# Patient Record
Sex: Male | Born: 1935 | Race: White | Hispanic: No | Marital: Married | Smoking: Former smoker
Health system: Southern US, Community
[De-identification: ages and names within clinical notes are randomized; demographics above are authoritative.]

## PROBLEM LIST (undated history)

## (undated) DIAGNOSIS — N183 Chronic kidney disease, stage 3 unspecified: Secondary | ICD-10-CM

## (undated) DIAGNOSIS — F32A Depression, unspecified: Secondary | ICD-10-CM

## (undated) DIAGNOSIS — I219 Acute myocardial infarction, unspecified: Secondary | ICD-10-CM

## (undated) DIAGNOSIS — J449 Chronic obstructive pulmonary disease, unspecified: Secondary | ICD-10-CM

## (undated) DIAGNOSIS — F329 Major depressive disorder, single episode, unspecified: Secondary | ICD-10-CM

## (undated) DIAGNOSIS — F319 Bipolar disorder, unspecified: Secondary | ICD-10-CM

## (undated) DIAGNOSIS — E7800 Pure hypercholesterolemia, unspecified: Secondary | ICD-10-CM

## (undated) DIAGNOSIS — C3490 Malignant neoplasm of unspecified part of unspecified bronchus or lung: Secondary | ICD-10-CM

## (undated) DIAGNOSIS — I350 Nonrheumatic aortic (valve) stenosis: Secondary | ICD-10-CM

## (undated) DIAGNOSIS — Z9861 Coronary angioplasty status: Secondary | ICD-10-CM

## (undated) DIAGNOSIS — K219 Gastro-esophageal reflux disease without esophagitis: Secondary | ICD-10-CM

## (undated) DIAGNOSIS — I10 Essential (primary) hypertension: Secondary | ICD-10-CM

## (undated) DIAGNOSIS — D649 Anemia, unspecified: Secondary | ICD-10-CM

## (undated) DIAGNOSIS — I2510 Atherosclerotic heart disease of native coronary artery without angina pectoris: Secondary | ICD-10-CM

## (undated) DIAGNOSIS — I4510 Unspecified right bundle-branch block: Secondary | ICD-10-CM

## (undated) DIAGNOSIS — I714 Abdominal aortic aneurysm, without rupture: Secondary | ICD-10-CM

## (undated) HISTORY — PX: LUNG LOBECTOMY: SHX167

---

## 2005-05-25 HISTORY — PX: OTHER SURGICAL HISTORY: SHX169

## 2009-05-25 DIAGNOSIS — I714 Abdominal aortic aneurysm, without rupture, unspecified: Secondary | ICD-10-CM

## 2009-05-25 HISTORY — DX: Abdominal aortic aneurysm, without rupture, unspecified: I71.40

## 2009-05-25 HISTORY — DX: Abdominal aortic aneurysm, without rupture: I71.4

## 2009-05-25 HISTORY — PX: ABDOMINAL AORTIC ANEURYSM REPAIR: SHX42

## 2017-02-22 DIAGNOSIS — I350 Nonrheumatic aortic (valve) stenosis: Secondary | ICD-10-CM

## 2017-02-22 HISTORY — DX: Nonrheumatic aortic (valve) stenosis: I35.0

## 2017-05-22 ENCOUNTER — Other Ambulatory Visit: Payer: Self-pay

## 2017-05-22 ENCOUNTER — Inpatient Hospital Stay (HOSPITAL_BASED_OUTPATIENT_CLINIC_OR_DEPARTMENT_OTHER)
Admission: EM | Admit: 2017-05-22 | Discharge: 2017-05-24 | DRG: 309 | Disposition: A | Discharge status: Home / Self Care | Payer: Medicare HMO | Attending: Internal Medicine | Admitting: Internal Medicine

## 2017-05-22 ENCOUNTER — Emergency Department (HOSPITAL_BASED_OUTPATIENT_CLINIC_OR_DEPARTMENT_OTHER): Payer: Medicare HMO

## 2017-05-22 ENCOUNTER — Encounter (HOSPITAL_BASED_OUTPATIENT_CLINIC_OR_DEPARTMENT_OTHER): Payer: Self-pay | Admitting: Emergency Medicine

## 2017-05-22 DIAGNOSIS — I248 Other forms of acute ischemic heart disease: Secondary | ICD-10-CM | POA: Diagnosis present

## 2017-05-22 DIAGNOSIS — J441 Chronic obstructive pulmonary disease with (acute) exacerbation: Secondary | ICD-10-CM | POA: Diagnosis present

## 2017-05-22 DIAGNOSIS — I714 Abdominal aortic aneurysm, without rupture, unspecified: Secondary | ICD-10-CM | POA: Diagnosis present

## 2017-05-22 DIAGNOSIS — Z85118 Personal history of other malignant neoplasm of bronchus and lung: Secondary | ICD-10-CM

## 2017-05-22 DIAGNOSIS — Z87891 Personal history of nicotine dependence: Secondary | ICD-10-CM | POA: Diagnosis not present

## 2017-05-22 DIAGNOSIS — I469 Cardiac arrest, cause unspecified: Secondary | ICD-10-CM | POA: Diagnosis not present

## 2017-05-22 DIAGNOSIS — Z888 Allergy status to other drugs, medicaments and biological substances status: Secondary | ICD-10-CM

## 2017-05-22 DIAGNOSIS — I4891 Unspecified atrial fibrillation: Secondary | ICD-10-CM | POA: Diagnosis present

## 2017-05-22 DIAGNOSIS — Z9861 Coronary angioplasty status: Secondary | ICD-10-CM | POA: Diagnosis not present

## 2017-05-22 DIAGNOSIS — I2510 Atherosclerotic heart disease of native coronary artery without angina pectoris: Secondary | ICD-10-CM | POA: Diagnosis present

## 2017-05-22 DIAGNOSIS — R402413 Glasgow coma scale score 13-15, at hospital admission: Secondary | ICD-10-CM | POA: Diagnosis present

## 2017-05-22 DIAGNOSIS — D631 Anemia in chronic kidney disease: Secondary | ICD-10-CM | POA: Diagnosis present

## 2017-05-22 DIAGNOSIS — J069 Acute upper respiratory infection, unspecified: Secondary | ICD-10-CM | POA: Diagnosis present

## 2017-05-22 DIAGNOSIS — Z902 Acquired absence of lung [part of]: Secondary | ICD-10-CM | POA: Diagnosis not present

## 2017-05-22 DIAGNOSIS — E785 Hyperlipidemia, unspecified: Secondary | ICD-10-CM | POA: Diagnosis present

## 2017-05-22 DIAGNOSIS — I4510 Unspecified right bundle-branch block: Secondary | ICD-10-CM | POA: Diagnosis present

## 2017-05-22 DIAGNOSIS — I350 Nonrheumatic aortic (valve) stenosis: Secondary | ICD-10-CM

## 2017-05-22 DIAGNOSIS — I701 Atherosclerosis of renal artery: Secondary | ICD-10-CM | POA: Diagnosis present

## 2017-05-22 DIAGNOSIS — I480 Paroxysmal atrial fibrillation: Principal | ICD-10-CM | POA: Diagnosis present

## 2017-05-22 DIAGNOSIS — Z955 Presence of coronary angioplasty implant and graft: Secondary | ICD-10-CM

## 2017-05-22 DIAGNOSIS — I129 Hypertensive chronic kidney disease with stage 1 through stage 4 chronic kidney disease, or unspecified chronic kidney disease: Secondary | ICD-10-CM | POA: Diagnosis present

## 2017-05-22 DIAGNOSIS — R0902 Hypoxemia: Secondary | ICD-10-CM

## 2017-05-22 DIAGNOSIS — N184 Chronic kidney disease, stage 4 (severe): Secondary | ICD-10-CM | POA: Diagnosis present

## 2017-05-22 DIAGNOSIS — R7303 Prediabetes: Secondary | ICD-10-CM | POA: Diagnosis present

## 2017-05-22 DIAGNOSIS — I252 Old myocardial infarction: Secondary | ICD-10-CM | POA: Diagnosis not present

## 2017-05-22 DIAGNOSIS — Z7982 Long term (current) use of aspirin: Secondary | ICD-10-CM

## 2017-05-22 DIAGNOSIS — N183 Chronic kidney disease, stage 3 unspecified: Secondary | ICD-10-CM | POA: Diagnosis present

## 2017-05-22 DIAGNOSIS — D649 Anemia, unspecified: Secondary | ICD-10-CM | POA: Diagnosis present

## 2017-05-22 DIAGNOSIS — I10 Essential (primary) hypertension: Secondary | ICD-10-CM | POA: Diagnosis present

## 2017-05-22 DIAGNOSIS — F319 Bipolar disorder, unspecified: Secondary | ICD-10-CM | POA: Diagnosis present

## 2017-05-22 HISTORY — DX: Atherosclerotic heart disease of native coronary artery without angina pectoris: I25.10

## 2017-05-22 HISTORY — DX: Anemia, unspecified: D64.9

## 2017-05-22 HISTORY — DX: Essential (primary) hypertension: I10

## 2017-05-22 HISTORY — DX: Malignant neoplasm of unspecified part of unspecified bronchus or lung: C34.90

## 2017-05-22 HISTORY — DX: Gastro-esophageal reflux disease without esophagitis: K21.9

## 2017-05-22 HISTORY — DX: Major depressive disorder, single episode, unspecified: F32.9

## 2017-05-22 HISTORY — DX: Coronary angioplasty status: Z98.61

## 2017-05-22 HISTORY — DX: Chronic obstructive pulmonary disease, unspecified: J44.9

## 2017-05-22 HISTORY — DX: Acute myocardial infarction, unspecified: I21.9

## 2017-05-22 HISTORY — DX: Chronic kidney disease, stage 3 (moderate): N18.3

## 2017-05-22 HISTORY — DX: Chronic kidney disease, stage 3 unspecified: N18.30

## 2017-05-22 HISTORY — DX: Pure hypercholesterolemia, unspecified: E78.00

## 2017-05-22 HISTORY — DX: Depression, unspecified: F32.A

## 2017-05-22 HISTORY — DX: Bipolar disorder, unspecified: F31.9

## 2017-05-22 HISTORY — DX: Unspecified right bundle-branch block: I45.10

## 2017-05-22 HISTORY — DX: Nonrheumatic aortic (valve) stenosis: I35.0

## 2017-05-22 HISTORY — DX: Abdominal aortic aneurysm, without rupture: I71.4

## 2017-05-22 LAB — BASIC METABOLIC PANEL
ANION GAP: 8 (ref 5–15)
BUN: 40 mg/dL — ABNORMAL HIGH (ref 6–20)
CALCIUM: 8.9 mg/dL (ref 8.9–10.3)
CO2: 26 mmol/L (ref 22–32)
CREATININE: 2.36 mg/dL — AB (ref 0.61–1.24)
Chloride: 106 mmol/L (ref 101–111)
GFR, EST AFRICAN AMERICAN: 27 mL/min — AB (ref >60)
GFR, EST NON AFRICAN AMERICAN: 24 mL/min — AB (ref >60)
Glucose, Bld: 160 mg/dL — ABNORMAL HIGH (ref 65–99)
Potassium: 4.2 mmol/L (ref 3.5–5.1)
SODIUM: 140 mmol/L (ref 135–145)

## 2017-05-22 LAB — TROPONIN I
TROPONIN I: 0.06 ng/mL — AB (ref <0.03)
TROPONIN I: 0.07 ng/mL — AB (ref <0.03)

## 2017-05-22 LAB — CBC
HCT: 34.3 % — ABNORMAL LOW (ref 39.0–52.0)
HEMOGLOBIN: 10.6 g/dL — AB (ref 13.0–17.0)
MCH: 30 pg (ref 26.0–34.0)
MCHC: 30.9 g/dL (ref 30.0–36.0)
MCV: 97.2 fL (ref 78.0–100.0)
PLATELETS: 196 10*3/uL (ref 150–400)
RBC: 3.53 MIL/uL — AB (ref 4.22–5.81)
RDW: 14.3 % (ref 11.5–15.5)
WBC: 7.8 10*3/uL (ref 4.0–10.5)

## 2017-05-22 LAB — MRSA PCR SCREENING: MRSA BY PCR: NEGATIVE

## 2017-05-22 LAB — TSH: TSH: 0.733 u[IU]/mL (ref 0.350–4.500)

## 2017-05-22 MED ORDER — HEPARIN (PORCINE) IN NACL 100-0.45 UNIT/ML-% IJ SOLN
1200.0000 [IU]/h | INTRAMUSCULAR | Status: DC
  Administered 2017-05-22: 1000 [IU]/h via INTRAVENOUS
  Administered 2017-05-23: 1200 [IU]/h via INTRAVENOUS
  Filled 2017-05-22 (×2): qty 250

## 2017-05-22 MED ORDER — ACETAMINOPHEN 325 MG PO TABS: 650.0000 mg | ORAL_TABLET | Freq: Four times a day (QID) | ORAL | Status: DC | PRN

## 2017-05-22 MED ORDER — ONDANSETRON HCL 4 MG PO TABS: 4.0000 mg | ORAL_TABLET | Freq: Four times a day (QID) | ORAL | Status: DC | PRN

## 2017-05-22 MED ORDER — DILTIAZEM HCL 100 MG IV SOLR
5.0000 mg/h | INTRAVENOUS | Status: DC
  Administered 2017-05-22: 15 mg/h via INTRAVENOUS
  Administered 2017-05-22: 5 mg/h via INTRAVENOUS
  Filled 2017-05-22 (×2): qty 100

## 2017-05-22 MED ORDER — DILTIAZEM LOAD VIA INFUSION
10.0000 mg | Freq: Once | INTRAVENOUS | Status: AC
  Administered 2017-05-22: 10 mg via INTRAVENOUS
  Filled 2017-05-22: qty 10

## 2017-05-22 MED ORDER — ALBUTEROL SULFATE (2.5 MG/3ML) 0.083% IN NEBU: 2.5000 mg | INHALATION_SOLUTION | Freq: Four times a day (QID) | RESPIRATORY_TRACT | Status: DC | PRN

## 2017-05-22 MED ORDER — ASPIRIN 325 MG PO TABS
325.0000 mg | ORAL_TABLET | Freq: Every day | ORAL | Status: DC
  Administered 2017-05-22 – 2017-05-23 (×2): 325 mg via ORAL
  Filled 2017-05-22 (×2): qty 1

## 2017-05-22 MED ORDER — HEPARIN BOLUS VIA INFUSION
3000.0000 [IU] | Freq: Once | INTRAVENOUS | Status: AC
Start: 2017-05-22 — End: 2017-05-22
  Administered 2017-05-22: 3000 [IU] via INTRAVENOUS

## 2017-05-22 MED ORDER — LAMOTRIGINE 100 MG PO TABS
300.0000 mg | ORAL_TABLET | Freq: Every day | ORAL | Status: DC
  Administered 2017-05-23 – 2017-05-24 (×2): 300 mg via ORAL
  Filled 2017-05-22 (×2): qty 3

## 2017-05-22 MED ORDER — MULTIVITAMINS PO CAPS: 1.0000 | ORAL_CAPSULE | Freq: Every day | ORAL | Status: DC

## 2017-05-22 MED ORDER — ADULT MULTIVITAMIN W/MINERALS CH
1.0000 | ORAL_TABLET | Freq: Every day | ORAL | Status: DC
  Administered 2017-05-23 – 2017-05-24 (×2): 1 via ORAL
  Filled 2017-05-22 (×2): qty 1

## 2017-05-22 MED ORDER — FEBUXOSTAT 40 MG PO TABS
40.0000 mg | ORAL_TABLET | Freq: Every day | ORAL | Status: DC
  Administered 2017-05-23 – 2017-05-24 (×2): 40 mg via ORAL
  Filled 2017-05-22 (×3): qty 1

## 2017-05-22 MED ORDER — ALBUTEROL SULFATE HFA 108 (90 BASE) MCG/ACT IN AERS: 2.0000 | INHALATION_SPRAY | Freq: Four times a day (QID) | RESPIRATORY_TRACT | Status: DC | PRN

## 2017-05-22 MED ORDER — ATORVASTATIN CALCIUM 10 MG PO TABS
10.0000 mg | ORAL_TABLET | Freq: Every day | ORAL | Status: DC
  Administered 2017-05-22 – 2017-05-23 (×2): 10 mg via ORAL
  Filled 2017-05-22 (×3): qty 1

## 2017-05-22 MED ORDER — ONDANSETRON HCL 4 MG/2ML IJ SOLN: 4.0000 mg | Freq: Four times a day (QID) | INTRAMUSCULAR | Status: DC | PRN

## 2017-05-22 MED ORDER — METOPROLOL TARTRATE 50 MG PO TABS
50.0000 mg | ORAL_TABLET | Freq: Two times a day (BID) | ORAL | Status: DC
  Administered 2017-05-22: 50 mg via ORAL
  Filled 2017-05-22: qty 1

## 2017-05-22 MED ORDER — ACETAMINOPHEN 650 MG RE SUPP: 650.0000 mg | Freq: Four times a day (QID) | RECTAL | Status: DC | PRN

## 2017-05-22 NOTE — Progress Notes (Signed)
ANTICOAGULATION CONSULT NOTE - Initial Consult  Pharmacy Consult for Heparin Indication: atrial fibrillation  Allergies  Allergen Reactions  . Norvasc [Amlodipine Besylate]     Ankle swelling    Patient Measurements: Height: 5\' 11"  (180.3 cm) Weight: 180 lb (81.6 kg) IBW/kg (Calculated) : 75.3  Vital Signs: Temp: 98.1 F (36.7 C) (12/29 1124) Temp Source: Oral (12/29 1124) BP: 118/71 (12/29 1230) Pulse Rate: 97 (12/29 1230)  Labs: Recent Labs    05/22/17 1125  HGB 10.6*  HCT 34.3*  PLT 196  CREATININE 2.36*    Estimated Creatinine Clearance: 24.4 mL/min (A) (by C-G formula based on SCr of 2.36 mg/dL (H)).   Medical History: Past Medical History:  Diagnosis Date  . High cholesterol   . Lung cancer ([place])   . MI (myocardial infarction) [facility])     Assessment: 83 year old male to begin heparin for Afib, with renal insufficiency, not on anti-coagulation prior to admission  Goal of Therapy:  Heparin level 0.3-0.7 units/ml Monitor platelets by anticoagulation protocol: Yes   Plan:  Heparin 3000 units iv bolus x 1 Heparin drip at 1000 units / hr Heparin level 8 hours after heparin starts Daily heparin level, CBC  Thank you Anette Guarneri, PharmD [phone]  05/22/2017,12:43 PM

## 2017-05-22 NOTE — ED Notes (Signed)
Patient transported to X-ray 

## 2017-05-22 NOTE — Progress Notes (Signed)
Pt converted to sinus bradycardia briefly with rate 58-61 and 3.2 sec pause after receiving PO metoprolol.  IV diltiazem was discontinued per standing parameters/orders.  Pt maintaining afib/flutter with rate 70s to 80s.  Pt denies any c/o.  NP notified via [place].  Will cont to monitor pt closely.

## 2017-05-22 NOTE — ED Notes (Signed)
Pt given snack with verbal approval from Dr. Laverta Baltimore. Family at bedside

## 2017-05-22 NOTE — ED Notes (Signed)
ED Provider at bedside. 

## 2017-05-22 NOTE — ED Triage Notes (Signed)
Pt c/o chest pain with shortness of breath onset yesterday around lunch. Symptoms resolved last night. This morning pt reports shortness of breath and feeling weakness about 30 mins after waking up. Speech clear, grips equal, no facial droop.

## 2017-05-22 NOTE — H&P (Signed)
History and Physical    Mark Myers EVO:[id number] DOB: Nov 09, 1929 DOA: 05/22/2017  Referring MD/NP/PA: EDP PCP:  Patient coming from: [place]  Chief Complaint: dizziness/chest pressure and dyspnea  HPI: Mark Myers is a 83 y.o. male with medical history significant of CAD with RCA stent in 2007, CKD3, AAA s/p EVAR in 2011, chronic right bundle branch block, prediabetes, prior history of lung cancer, bipolar disorder presented to the ER with the above symptoms. Patient reports being his in his usual state of health until yesterday when he noticed feeling a little dizzy and weak and mild vague tightness in the middle of his chest, this subsequently subsided and he went to bed after waking up this morning he started feeling dizzy again, this was associated with weakness and mild shortness of breath. He has a chronic cough but denies any changes, also has mild leg edema at baseline. He denies any changes in his medications no fevers or chills no nausea vomiting or diarrhea ED Course: In the emergency room [facility] he was noted to have rapid heart rate of 124 which was irregular with 5 QRS in the setting of known baseline right bundle branch block. Subsequently ED resident called cardiology on call who felt that the rhythm was consistent with atrial fibrillation in the background of old RBBB and hence he was started on a Cardizem drip along with heparin drip and transferred to [facility], troponin I minimally elevated at 0.06 and creatinine at 2.3 which is close to his baseline of 2. Chest x-ray raises concern for patchy consolidation at bases,  ?? Pneumonia  Review of Systems: As per HPI otherwise 14 point review of systems negative.   Past Medical History:  Diagnosis Date  . High cholesterol   . Lung cancer ([place])   . MI (myocardial infarction) [facility])     Past Surgical History:  Procedure Laterality Date  . heart stents     x 2      reports that he has quit  smoking. he has never used smokeless tobacco. He reports that he drinks alcohol. He reports that he does not use drugs.  Allergies  Allergen Reactions  . Norvasc [Amlodipine Besylate]     Ankle swelling    No family history on file.   Prior to Admission medications   Medication Sig Start Date End Date Taking? Authorizing Provider  aspirin 325 MG tablet Take 325 mg by mouth daily.   Yes [provider]  atorvastatin (LIPITOR) 10 MG tablet Take 10 mg by mouth daily.   Yes [provider]  Cholecalciferol (VITAMIN D-1000 MAX ST) 1000 units tablet Take 1,000 Units by mouth daily.   Yes [provider]  doxazosin (CARDURA) 2 MG tablet Take 2 mg by mouth every evening. 05/17/17  Yes [provider]  furosemide (LASIX) 20 MG tablet Take 20 mg by mouth.   Yes [provider]  hydrALAZINE (APRESOLINE) 50 MG tablet Take 50 mg by mouth 3 (three) times daily. 05/11/17  Yes [provider]  hydrochlorothiazide (HYDRODIURIL) 25 MG tablet Take 25 mg by mouth daily.   Yes [provider]  lamoTRIgine (LAMICTAL) 100 MG tablet Take 300 mg by mouth daily. 03/29/17  Yes [provider]  metoprolol tartrate (LOPRESSOR) 25 MG tablet Take 25 mg by mouth 2 (two) times daily.   Yes [provider]  Multiple Vitamin (MULTIVITAMIN) capsule Take 1 capsule by mouth daily. 11/07/09  Yes [provider]  ULORIC 40 MG tablet Take 40 mg by mouth daily. 04/19/17  Yes [provider]  VENTOLIN HFA 108 (90 Base) MCG/ACT inhaler Inhale 2 puffs into the lungs every 6 (six) hours as needed for wheezing. 05/17/17  Yes [provider]    Physical Exam: Vitals:   05/22/17 1646 05/22/17 1650 05/22/17 1657 05/22/17 1757  BP:    (!) 128/59  Pulse: (!) 133 80 78 (!) 125  Resp: (!) 25 (!) 21 (!) 21   Temp:    98.6 F (37 C)  TempSrc:    Oral  SpO2: 98% 98% 97% 96%  Weight:      Height:          Constitutional: NAD,  calm, comfortable Vitals:   05/22/17 1646 05/22/17 1650 05/22/17 1657 05/22/17 1757  BP:    (!) 128/59  Pulse: (!) 133 80 78 (!) 125  Resp: (!) 25 (!) 21 (!) 21   Temp:    98.6 F (37 C)  TempSrc:    Oral  SpO2: 98% 98% 97% 96%  Weight:      Height:       Eyes: PERRL, lids and conjunctivae normal ENMT: Mucous membranes are moist.  Neck: normal, supple, no JVD Respiratory: Poor air movement, decreased breath sounds at both bases, no wheezes or rhonchi noted Cardiovascular: S1-S2 irregularly irregular, systolic ejection murmur noted Abdomen: soft, non tender, Bowel sounds positive.  Musculoskeletal: No joint deformity upper and lower extremities. Ext: 1+ edema Skin: no rashes, lesions, ulcers.  Neurologic: Moves all extremities no localizing signs Psychiatric: Normal judgment and insight. Alert and oriented x 3. Normal mood.   Labs on Admission: I have personally reviewed following labs and imaging studies  CBC: Recent Labs  Lab 05/22/17 1125  WBC 7.8  HGB 10.6*  HCT 34.3*  MCV 97.2  PLT 315   Basic Metabolic Panel: Recent Labs  Lab 05/22/17 1125  NA 140  K 4.2  CL 106  CO2 26  GLUCOSE 160*  BUN 40*  CREATININE 2.36*  CALCIUM 8.9   GFR: Estimated Creatinine Clearance: 24.4 mL/min (A) (by C-G formula based on SCr of 2.36 mg/dL (H)). Liver Function Tests: No results for input(s): AST, ALT, ALKPHOS, BILITOT, PROT, ALBUMIN in the last 168 hours. No results for input(s): LIPASE, AMYLASE in the last 168 hours. No results for input(s): AMMONIA in the last 168 hours. Coagulation Profile: No results for input(s): INR, PROTIME in the last 168 hours. Cardiac Enzymes: Recent Labs  Lab 05/22/17 1124  TROPONINI 0.06*   BNP (last 3 results) No results for input(s): PROBNP in the last 8760 hours. HbA1C: No results for input(s): HGBA1C in the last 72 hours. CBG: No results for input(s): GLUCAP in the last 168 hours. Lipid Profile: No results for input(s): CHOL,  HDL, LDLCALC, TRIG, CHOLHDL, LDLDIRECT in the last 72 hours. Thyroid Function Tests: No results for input(s): TSH, T4TOTAL, FREET4, T3FREE, THYROIDAB in the last 72 hours. Anemia Panel: No results for input(s): VITAMINB12, FOLATE, FERRITIN, TIBC, IRON, RETICCTPCT in the last 72 hours. Urine analysis: No results found for: COLORURINE, APPEARANCEUR, LABSPEC, PHURINE, GLUCOSEU, HGBUR, BILIRUBINUR, KETONESUR, PROTEINUR, UROBILINOGEN, NITRITE, LEUKOCYTESUR Sepsis Labs: @LABRCNTIP (procalcitonin:4,lacticidven:4) )No results found for this or any previous visit (from the past 240 hour(s)).   Radiological Exams on Admission: Dg Chest 2 View  Result Date: 05/22/2017 CLINICAL DATA:  Shortness of breath since yesterday EXAM: CHEST  2 VIEW COMPARISON:  None. FINDINGS: The mediastinal contour is normal. The heart size is  mildly enlarged. Patchy consolidation of bilateral lung bases are identified. There is no pulmonary edema or pleural effusion. The lungs are hyperinflated. No acute abnormality is identified in the visualized bones. Aortic stent is noted. IMPRESSION: Patchy consolidation of bilateral lung bases, pneumonia is not excluded particularly in the right lung base. Electronically Signed   By: Abelardo Diesel M.D.   On: 05/22/2017 12:16    EKG: Independently reviewed. RBBB with non specific ST T wave changes  Assessment/Plan    Atrial fibrillation with RVR (HCC) -not very clear since has RBBB as underlying rhythm -Started on a Cardizem and heparin drip, continue same -chads2vasc score>3 -will need cardiology consult in a.m -Resume metoprolol, , will increase home dose from 25 to 50 mg twice a day -Check TSH, he recently had an echocardiogram done at [facility] 2 months ago, I'm not able to see this in care everywhere, if this cannot be accessed will need repeat echo  History of CAD  -remote PCI/stent to RCA in 2007 -troponin minimally elevated likley due to demand will cycle  troponin -Continue beta blocker and statin, add aspirin  Moderate to severe aortic stenosis -Followed by cardiologist at [facility]  CKD 3 -Creatinine close to baseline, monitor -Avoid hypotension  COPD -Stable, nebs when necessary  ?? Patchy consolidation at bases on portable chest x-ray -Clinically no signs of pneumonia, he's had a chronic cough for months to years -No fever or leukocytosis -Repeat chest x-ray tomorrow, monitor without antibiotics   AAA s/p EVAR in 2011    H/o Lung Ca s/p resection, - no recurrence based on last scans in [facility]  DVT prophylaxis: on heparin Code Status: Full Code Family Communication: son at bedside Disposition Plan: home when stable Consults called: -Needs cards consult in am  Admission status: inpatient  Domenic Polite MD [facility] Pager [phone]  If 7PM-7AM, please contact night-coverage [email or web address] Password [facility]  05/22/2017, 6:37 PM

## 2017-05-22 NOTE — ED Notes (Signed)
Urinal provided.

## 2017-05-22 NOTE — ED Notes (Signed)
Dr. Laverta Baltimore at bedside to discuss admission with pt and family

## 2017-05-22 NOTE — ED Provider Notes (Signed)
[facility] EMERGENCY DEPARTMENT Provider Note   CSN: [id number] Arrival date & time: 05/22/17  1110     History   Chief Complaint Chief Complaint  Patient presents with  . Shortness of Breath  . Chest Pain    HPI Mark Myers is a 83 y.o. male.  HPI 83 yo male with PMH of CAD with stent in 2007, CKD3, RBBB, renal artery stenosis, aortic stenosis, hx of lung cancer, Bipolar DO, prediabetes, AAA s/p EVAR 2011 who presents with generalized weakness and dyspnea on exertion. Symptoms began last night while he was watching tv. He started to have some chest tightness without associated shortness of breath, nausea or diaphoresis.This lasted a few minutes and self resolved. He reports that it actually improved with ambulation. It was not similar to his chest pain symptoms in 2007 when he was diagnosed with MI. This morning he woke up and was getting ready. He felt generally weak and had dyspnea on exertion. He checked his heart rate which was in the 130s which prompted him to come to the ED for evaluation. He currently has not chest pain or shortness of breath. His son reported of dyspnea on exertion from the ED parking lot to the entrance. Patient also reports of mild lower extremity swelling bilaterally for the past month; this improves with elevation of his legs. Denies recent travel or calf pain. No personal history of DVT or PE.   Past Medical History:  Diagnosis Date  . High cholesterol   . Lung cancer ([place])   . MI (myocardial infarction) [facility])     Patient Active Problem List   Diagnosis Date Noted  . Atrial fibrillation with RVR ([place]) 05/22/2017    Past Surgical History:  Procedure Laterality Date  . heart stents     x 2        Home Medications    Prior to Admission medications   Medication Sig Start Date End Date Taking? Authorizing Provider  aspirin 325 MG tablet Take 325 mg by mouth daily.   Yes [provider]  atorvastatin (LIPITOR) 10 MG tablet  Take 10 mg by mouth daily.   Yes [provider]  furosemide (LASIX) 20 MG tablet Take 20 mg by mouth.   Yes [provider]  hydrochlorothiazide (HYDRODIURIL) 25 MG tablet Take 25 mg by mouth daily.   Yes [provider]  metoprolol tartrate (LOPRESSOR) 25 MG tablet Take 25 mg by mouth 2 (two) times daily.   Yes [provider]    Family History No family history on file.  Social History Social History   Tobacco Use  . Smoking status: Former Research scientist (life sciences)  . Smokeless tobacco: Never Used  Substance Use Topics  . Alcohol use: Yes    Frequency: Never  . Drug use: No     Allergies   Norvasc [amlodipine besylate]   Review of Systems Review of Systems  Constitutional: Negative for chills and fever.  HENT: Negative for rhinorrhea and sore throat.   Respiratory: Positive for chest tightness and shortness of breath (dyspnea on exertion). Negative for cough.   Gastrointestinal: Negative for abdominal pain, diarrhea and nausea.  Neurological: Positive for weakness (generalized).  Psychiatric/Behavioral: Negative for confusion.     Physical Exam Updated Vital Signs BP 121/82   Pulse (!) 131   Temp 98.1 F (36.7 C) (Oral)   Resp 16   Ht 5\' 11"  (1.803 m)   Wt 81.6 kg (180 lb)   SpO2 97%  BMI 25.10 kg/m   Physical Exam  Constitutional: He is oriented to person, place, and time. He appears well-developed and well-nourished.  Non-toxic appearance. He does not appear ill.  Eyes: EOM are normal. Pupils are equal, round, and reactive to light.  Cardiovascular:  Murmur heard. Irregularly irregular, tachycardia   Pulmonary/Chest: Effort normal. No accessory muscle usage. Tachypnea (mild) noted. No respiratory distress. He has no decreased breath sounds. He has no wheezes. He has no rhonchi. He has no rales.  Abdominal: Soft. Bowel sounds are normal. There is no tenderness.  Neurological: He is alert and oriented to person, place, and time. He has  normal strength. No cranial nerve deficit or sensory deficit.  Skin: Skin is warm and dry. Capillary refill takes less than 2 seconds. He is not diaphoretic.  Psychiatric: He has a normal mood and affect. His behavior is normal.     ED Treatments / Results  Labs (all labs ordered are listed, but only abnormal results are displayed) Labs Reviewed  BASIC METABOLIC PANEL - Abnormal; Notable for the following components:      Result Value   Glucose, Bld 160 (*)    BUN 40 (*)    Creatinine, Ser 2.36 (*)    GFR calc non Af Amer 24 (*)    GFR calc Af Amer 27 (*)    All other components within normal limits  CBC - Abnormal; Notable for the following components:   RBC 3.53 (*)    Hemoglobin 10.6 (*)    HCT 34.3 (*)    All other components within normal limits  TROPONIN I - Abnormal; Notable for the following components:   Troponin I 0.06 (*)    All other components within normal limits  HEPARIN LEVEL (UNFRACTIONATED)    EKG  EKG Interpretation  Date/Time:  Saturday May 22 2017 11:42:06 EST Ventricular Rate:  86 PR Interval:    QRS Duration: 134 QT Interval:  397 QTC Calculation: 475 R Axis:   -105 Text Interpretation:  Sinus rhythm Right bundle branch block No STEMI.  Confirmed by Nanda Quinton [phone]) on 05/22/2017 12:03:47 PM       Radiology Dg Chest 2 View  Result Date: 05/22/2017 CLINICAL DATA:  Shortness of breath since yesterday EXAM: CHEST  2 VIEW COMPARISON:  None. FINDINGS: The mediastinal contour is normal. The heart size is mildly enlarged. Patchy consolidation of bilateral lung bases are identified. There is no pulmonary edema or pleural effusion. The lungs are hyperinflated. No acute abnormality is identified in the visualized bones. Aortic stent is noted. IMPRESSION: Patchy consolidation of bilateral lung bases, pneumonia is not excluded particularly in the right lung base. Electronically Signed   By: Abelardo Diesel M.D.   On: 05/22/2017 12:16     Procedures Procedures (including critical care time)  Medications Ordered in ED Medications  diltiazem (CARDIZEM) 1 mg/mL load via infusion 10 mg (10 mg Intravenous Bolus from Bag 05/22/17 1220)    And  diltiazem (CARDIZEM) 100 mg in dextrose 5 % 100 mL (1 mg/mL) infusion (10 mg/hr Intravenous Rate/Dose Change 05/22/17 1256)  heparin ADULT infusion 100 units/mL (25000 units/253mL sodium chloride 0.45%) (1,000 Units/hr Intravenous New Bag/Given 05/22/17 1324)  heparin bolus via infusion 3,000 Units (3,000 Units Intravenous Bolus from Bag 05/22/17 1324)     Initial Impression / Assessment and Plan / ED Course  I have reviewed the triage vital signs and the nursing notes.  Pertinent labs & imaging results that were available during my care of  the patient were reviewed by me and considered in my medical decision making (see chart for details).  83 yo male with PMH of CAD with stent in 2007, CKD3, RBBB, renal artery stenosis, aortic stenosis, hx of lung cancer, Bipolar DO, prediabetes, AAA s/p EVAR 2011 who presents with generalized weakness and dyspnea on exertion. Initial vitals with HR of 124 irregularly irregular but with stable blood pressure and no current symptoms. EKG with wide QRS (he has known RBBB). Second EKG obtained during a period of time with normal HR; EKG at that time revealed NSR with RBBB. Also discussed with on call cardiology. Rhythm is consistent with atrial fibrillation (in the setting of old RBBB). Started patient on Cardizem drip. His heart rate improved to upper 90s to low 100 with titration of Cardizem. CBC with stable anemia. BMP with mild AKI to 2.36 (baseline appears around 2). Troponin I 0.06, likely mildly elevated due to demand in the setting of afib. Spoke with [facility] who will accept patient for admission. Also started on IV heparin due to [facility] of at least 2.   Final Clinical Impressions(s) / ED Diagnoses   Final diagnoses:  Atrial fibrillation with  RVR [facility])    ED Discharge Orders    None       Smiley Houseman, MD 05/22/17 1400    Margette Fast, MD 05/22/17 [phone]

## 2017-05-22 NOTE — Plan of Care (Signed)
Oriented to unit/room. POC discussed with Pt and family.

## 2017-05-22 NOTE — ED Notes (Signed)
Troponin 0.06, results given to ED MD

## 2017-05-23 ENCOUNTER — Encounter (HOSPITAL_COMMUNITY): Payer: Self-pay | Admitting: Cardiology

## 2017-05-23 ENCOUNTER — Inpatient Hospital Stay (HOSPITAL_COMMUNITY): Payer: Medicare HMO

## 2017-05-23 DIAGNOSIS — N183 Chronic kidney disease, stage 3 unspecified: Secondary | ICD-10-CM | POA: Diagnosis present

## 2017-05-23 DIAGNOSIS — J069 Acute upper respiratory infection, unspecified: Secondary | ICD-10-CM | POA: Diagnosis present

## 2017-05-23 DIAGNOSIS — F319 Bipolar disorder, unspecified: Secondary | ICD-10-CM | POA: Diagnosis present

## 2017-05-23 DIAGNOSIS — I4510 Unspecified right bundle-branch block: Secondary | ICD-10-CM | POA: Diagnosis present

## 2017-05-23 DIAGNOSIS — I714 Abdominal aortic aneurysm, without rupture: Secondary | ICD-10-CM

## 2017-05-23 DIAGNOSIS — Z9861 Coronary angioplasty status: Secondary | ICD-10-CM

## 2017-05-23 DIAGNOSIS — I2510 Atherosclerotic heart disease of native coronary artery without angina pectoris: Secondary | ICD-10-CM

## 2017-05-23 DIAGNOSIS — D649 Anemia, unspecified: Secondary | ICD-10-CM | POA: Diagnosis present

## 2017-05-23 DIAGNOSIS — I10 Essential (primary) hypertension: Secondary | ICD-10-CM | POA: Diagnosis present

## 2017-05-23 DIAGNOSIS — I469 Cardiac arrest, cause unspecified: Secondary | ICD-10-CM | POA: Diagnosis not present

## 2017-05-23 LAB — CBC
HEMATOCRIT: 31.4 % — AB (ref 39.0–52.0)
Hemoglobin: 9.7 g/dL — ABNORMAL LOW (ref 13.0–17.0)
MCH: 29.7 pg (ref 26.0–34.0)
MCHC: 30.9 g/dL (ref 30.0–36.0)
MCV: 96 fL (ref 78.0–100.0)
Platelets: 186 10*3/uL (ref 150–400)
RBC: 3.27 MIL/uL — ABNORMAL LOW (ref 4.22–5.81)
RDW: 14.9 % (ref 11.5–15.5)
WBC: 6.2 10*3/uL (ref 4.0–10.5)

## 2017-05-23 LAB — COMPREHENSIVE METABOLIC PANEL
ALK PHOS: 62 U/L (ref 38–126)
ALT: 11 U/L — ABNORMAL LOW (ref 17–63)
ANION GAP: 6 (ref 5–15)
AST: 16 U/L (ref 15–41)
Albumin: 2.8 g/dL — ABNORMAL LOW (ref 3.5–5.0)
BILIRUBIN TOTAL: 0.6 mg/dL (ref 0.3–1.2)
BUN: 33 mg/dL — ABNORMAL HIGH (ref 6–20)
CALCIUM: 8.5 mg/dL — AB (ref 8.9–10.3)
CO2: 25 mmol/L (ref 22–32)
CREATININE: 2.21 mg/dL — AB (ref 0.61–1.24)
Chloride: 110 mmol/L (ref 101–111)
GFR, EST AFRICAN AMERICAN: 30 mL/min — AB (ref >60)
GFR, EST NON AFRICAN AMERICAN: 25 mL/min — AB (ref >60)
Glucose, Bld: 117 mg/dL — ABNORMAL HIGH (ref 65–99)
Potassium: 4.3 mmol/L (ref 3.5–5.1)
Sodium: 141 mmol/L (ref 135–145)
TOTAL PROTEIN: 5.4 g/dL — AB (ref 6.5–8.1)

## 2017-05-23 LAB — TROPONIN I: Troponin I: 0.07 ng/mL (ref <0.03)

## 2017-05-23 LAB — HEPARIN LEVEL (UNFRACTIONATED)
Heparin Unfractionated: 0.17 IU/mL — ABNORMAL LOW (ref 0.30–0.70)
Heparin Unfractionated: 0.41 IU/mL (ref 0.30–0.70)

## 2017-05-23 MED ORDER — METOPROLOL TARTRATE 25 MG PO TABS
37.5000 mg | ORAL_TABLET | Freq: Two times a day (BID) | ORAL | Status: DC
  Administered 2017-05-23 – 2017-05-24 (×2): 37.5 mg via ORAL
  Filled 2017-05-23 (×2): qty 1

## 2017-05-23 MED ORDER — PREDNISONE 20 MG PO TABS
40.0000 mg | ORAL_TABLET | Freq: Every day | ORAL | Status: DC
  Administered 2017-05-23 – 2017-05-24 (×2): 40 mg via ORAL
  Filled 2017-05-23 (×2): qty 2

## 2017-05-23 MED ORDER — HEPARIN BOLUS VIA INFUSION
2000.0000 [IU] | Freq: Once | INTRAVENOUS | Status: AC
  Administered 2017-05-23: 2000 [IU] via INTRAVENOUS
  Filled 2017-05-23: qty 2000

## 2017-05-23 MED ORDER — METOPROLOL TARTRATE 25 MG PO TABS
25.0000 mg | ORAL_TABLET | Freq: Two times a day (BID) | ORAL | Status: DC
  Administered 2017-05-23: 25 mg via ORAL
  Filled 2017-05-23: qty 1

## 2017-05-23 MED ORDER — IPRATROPIUM-ALBUTEROL 0.5-2.5 (3) MG/3ML IN SOLN
3.0000 mL | Freq: Four times a day (QID) | RESPIRATORY_TRACT | Status: DC
  Administered 2017-05-23 – 2017-05-24 (×3): 3 mL via RESPIRATORY_TRACT
  Filled 2017-05-23 (×4): qty 3

## 2017-05-23 MED ORDER — APIXABAN 2.5 MG PO TABS
2.5000 mg | ORAL_TABLET | Freq: Two times a day (BID) | ORAL | Status: DC
  Administered 2017-05-23 – 2017-05-24 (×3): 2.5 mg via ORAL
  Filled 2017-05-23 (×3): qty 1

## 2017-05-23 MED ORDER — ASPIRIN 81 MG PO CHEW
81.0000 mg | CHEWABLE_TABLET | Freq: Every day | ORAL | Status: DC
  Administered 2017-05-24: 81 mg via ORAL
  Filled 2017-05-23: qty 1

## 2017-05-23 NOTE — Progress Notes (Signed)
ANTICOAGULATION CONSULT NOTE - Follow Up Consult  Pharmacy Consult for heparin Indication: atrial fibrillation  Labs: Recent Labs    05/22/17 1124 05/22/17 1125 05/22/17 1848 05/22/17 2340  HGB  --  10.6*  --  9.7*  HCT  --  34.3*  --  31.4*  PLT  --  196  --  186  HEPARINUNFRC  --   --   --  0.17*  CREATININE  --  2.36*  --   --   TROPONINI 0.06*  --  0.07*  --     Assessment: 83yo male subtherapeutic on heparin with initial dosing for Afib.  Goal of Therapy:  Heparin level 0.3-0.7 units/ml   Plan:  Will rebolus with heparin 2000 units and increase gtt by 3 units/kg/hr to 1200 units/hr and check level in [place], PharmD, BCPS  05/23/2017,1:30 AM

## 2017-05-23 NOTE — Progress Notes (Signed)
ANTICOAGULATION CONSULT NOTE - Follow Up Consult  Pharmacy Consult for Heparin Indication: atrial fibrillation  Allergies  Allergen Reactions  . Norvasc [Amlodipine Besylate]     Ankle swelling    Patient Measurements: Height: 5\' 11"  (180.3 cm) Weight: 185 lb 6.4 oz (84.1 kg) IBW/kg (Calculated) : 75.3 Heparin Dosing Weight: 81.6 kg  Vital Signs: Temp: 97.7 F (36.5 C) (12/30 0820) Temp Source: Oral (12/30 0820) BP: 150/77 (12/30 0820) Pulse Rate: 71 (12/30 0820)  Labs: Recent Labs    05/22/17 1124 05/22/17 1125 05/22/17 1848 05/22/17 2340 05/23/17 0905  HGB  --  10.6*  --  9.7*  --   HCT  --  34.3*  --  31.4*  --   PLT  --  196  --  186  --   HEPARINUNFRC  --   --   --  0.17* 0.41  CREATININE  --  2.36*  --  2.21*  --   TROPONINI 0.06*  --  0.07* 0.07*  --     Estimated Creatinine Clearance: 26 mL/min (A) (by C-G formula based on SCr of 2.21 mg/dL (H)).  Assessment: 65 yoM presents with dizziness, chest pressure, and SOB, found to be in afib with RVR. Pharmacy consulted to dose IV heparin. Heparin level now therapeutic at 0.41 after re-bolus and rate increase. Hgb 9.7, pltc WNL. No bleeding noted.  Goal of Therapy:  Heparin level 0.3-0.7 units/ml Monitor platelets by anticoagulation protocol: Yes   Plan:  Continue heparin gtt at 1200 units/hr Daily heparin level, CBC Monitor for s/sx of bleeding  Erin N. Gerarda Fraction, PharmD PGY1 Pharmacy Resident Pager: [phone] 05/23/2017,9:52 AM

## 2017-05-23 NOTE — Progress Notes (Signed)
PROGRESS NOTE    Mark Myers  VQQ:[id number] DOB: Apr 09, 1930 DOA: 05/22/2017 PCP: No primary care provider on file.  Brief Narrative: Mark Myers is a 83 y.o. male with medical history significant of CAD with RCA stent in 2007, CKD3, AAA s/p EVAR in 2011, chronic right bundle branch block, prediabetes, prior history of lung cancer, bipolar disorder presented to the ER with the above symptoms. Patient reports being his in his usual state of health until yesterday when he noticed feeling a little dizzy and weak and mild vague tightness in the middle of his chest, this subsequently subsided and he went to bed after waking up this morning he started feeling dizzy again, this was associated with weakness and mild shortness of breath In ED, found to be in AFib with RVR started Cardizem gtt with heparin and [facility]  Assessment & Plan:   Atrial fibrillation with RVR ([place]) -has RBBB as underlying rhythm -He was started on a Cardizem drip yesterday evening, subsequently overnight converted to sinus rhythm with a 5 second pause, Cardizem drip discontinued -chads2vasc score>3 -Cardiology consulted -I had increased his home dose of metoprolol -We will consider transition to anticoagulation, possibly use lower dose Eliquis -Closely followed by cardiologist at [facility] and reportedly has severe aortic stenosis based on last echo in October which were not able to see  History of CAD  -remote PCI/stent to RCA in 2007 -troponin minimally elevated likley due to demand will cycle troponin -Continue beta blocker and statin  Moderate to severe aortic stenosis -Followed by cardiologist at [facility]  CKD 3 -Creatinine close to baseline, monitor -Avoid hypotension  COPD -Stable, nebs when necessary  ?? Patchy consolidation at bases on portable chest x-ray -Clinically no signs of pneumonia, he's had a chronic cough for months to years -No fever or leukocytosis -Repeat chest x-ray today, add  nebs, low dose prednisone and monitor   AAA s/p EVAR in 2011    H/o Lung Ca s/p resection, - no recurrence based on last scans in [facility]  DVT prophylaxis: on heparin Code Status: Full Code Family Communication: son at bedside Disposition Plan: home when stable    Consultants:   Cardiology   Procedures:   Antimicrobials:    Subjective: -feels much better, converted ti NSR last night following a 5sec pause  Objective: Vitals:   05/23/17 0000 05/23/17 0445 05/23/17 0820 05/23/17 1100  BP: 129/66 127/83 (!) 150/77 (!) 153/81  Pulse: 71 72 71 62  Resp: 20 20    Temp: 98.7 F (37.1 C) 98.3 F (36.8 C) 97.7 F (36.5 C) 98.4 F (36.9 C)  TempSrc: Oral Oral Oral Oral  SpO2: 96% 96% 99% 98%  Weight:  84.1 kg (185 lb 6.4 oz)    Height:        Intake/Output Summary (Last 24 hours) at 05/23/2017 1201 Last data filed at 05/23/2017 0300 Gross per 24 hour  Intake 352.37 ml  Output 750 ml  Net -397.63 ml   Filed Weights   05/22/17 1126 05/23/17 0445  Weight: 81.6 kg (180 lb) 84.1 kg (185 lb 6.4 oz)    Examination:  General exam: Appears calm and comfortable  Respiratory system: decreased BS on left, rare wheezes Cardiovascular system: S1 & S2 heard, RRR. No JVD, murmurs, rubs, gallops Gastrointestinal system: Abdomen is nondistended, soft and nontender.Normal bowel sounds heard. Central nervous system: Alert and oriented. No focal neurological deficits. Extremities: Symmetric 5 x 5 power. Skin: No rashes, lesions or ulcers Psychiatry: Judgement and insight  appear normal. Mood & affect appropriate.     Data Reviewed:   CBC: Recent Labs  Lab 05/22/17 1125 05/22/17 2340  WBC 7.8 6.2  HGB 10.6* 9.7*  HCT 34.3* 31.4*  MCV 97.2 96.0  PLT 196 062   Basic Metabolic Panel: Recent Labs  Lab 05/22/17 1125 05/22/17 2340  NA 140 141  K 4.2 4.3  CL 106 110  CO2 26 25  GLUCOSE 160* 117*  BUN 40* 33*  CREATININE 2.36* 2.21*  CALCIUM 8.9 8.5*    GFR: Estimated Creatinine Clearance: 26 mL/min (A) (by C-G formula based on SCr of 2.21 mg/dL (H)). Liver Function Tests: Recent Labs  Lab 05/22/17 2340  AST 16  ALT 11*  ALKPHOS 62  BILITOT 0.6  PROT 5.4*  ALBUMIN 2.8*   No results for input(s): LIPASE, AMYLASE in the last 168 hours. No results for input(s): AMMONIA in the last 168 hours. Coagulation Profile: No results for input(s): INR, PROTIME in the last 168 hours. Cardiac Enzymes: Recent Labs  Lab 05/22/17 1124 05/22/17 1848 05/22/17 2340  TROPONINI 0.06* 0.07* 0.07*   BNP (last 3 results) No results for input(s): PROBNP in the last 8760 hours. HbA1C: No results for input(s): HGBA1C in the last 72 hours. CBG: No results for input(s): GLUCAP in the last 168 hours. Lipid Profile: No results for input(s): CHOL, HDL, LDLCALC, TRIG, CHOLHDL, LDLDIRECT in the last 72 hours. Thyroid Function Tests: Recent Labs    05/22/17 1848  TSH 0.733   Anemia Panel: No results for input(s): VITAMINB12, FOLATE, FERRITIN, TIBC, IRON, RETICCTPCT in the last 72 hours. Urine analysis: No results found for: COLORURINE, APPEARANCEUR, LABSPEC, [place], GLUCOSEU, HGBUR, BILIRUBINUR, [place], [place], UROBILINOGEN, NITRITE, LEUKOCYTESUR Sepsis Labs: @LABRCNTIP (procalcitonin:4,lacticidven:4)  ) Recent Results (from the past 240 hour(s))  MRSA PCR Screening     Status: None   Collection Time: 05/22/17  6:04 PM  Result Value Ref Range Status   MRSA by PCR NEGATIVE NEGATIVE Final    Comment:        The GeneXpert MRSA Assay (FDA approved for NASAL specimens only), is one component of a comprehensive MRSA colonization surveillance program. It is not intended to diagnose MRSA infection nor to guide or monitor treatment for MRSA infections.          Radiology Studies: Dg Chest 2 View  Result Date: 05/22/2017 CLINICAL DATA:  Shortness of breath since yesterday EXAM: CHEST  2 VIEW COMPARISON:  None. FINDINGS: The  mediastinal contour is normal. The heart size is mildly enlarged. Patchy consolidation of bilateral lung bases are identified. There is no pulmonary edema or pleural effusion. The lungs are hyperinflated. No acute abnormality is identified in the visualized bones. Aortic stent is noted. IMPRESSION: Patchy consolidation of bilateral lung bases, pneumonia is not excluded particularly in the right lung base. Electronically Signed   By: Abelardo Diesel M.D.   On: 05/22/2017 12:16        Scheduled Meds: . apixaban  2.5 mg Oral BID  . aspirin  81 mg Oral Daily  . atorvastatin  10 mg Oral Daily  . febuxostat  40 mg Oral Daily  . ipratropium-albuterol  3 mL Nebulization Q6H  . lamoTRIgine  300 mg Oral Daily  . metoprolol tartrate  37.5 mg Oral BID  . multivitamin with minerals  1 tablet Oral Daily  . predniSONE  40 mg Oral Q breakfast   Continuous Infusions:   LOS: 1 day    Time spent: 66min    Nabila Albarracin  Broadus John, MD [facility] Page via [email or web address], password [email or web address] After 7PM please contact night-coverage  05/23/2017, 12:01 PM

## 2017-05-23 NOTE — Consult Note (Signed)
Cardiology Consultation:   Patient ID: Mark Myers; [id number]; August 21, 1929   Admit date: 05/22/2017 Date of Consult: 05/23/2017  Primary Care Provider: Dr Oswaldo Milian Primary Cardiologist: Dr Wyline Copas Nephrologist: Dr Olivia Mackie Vascular: Dr Maryjean Morn   Patient Profile:   Mark Myers is a 83 y.o. male with a hx of AS, CAD, CRI followed in [place] who is being seen today for the evaluation of PAF at the request of Dr Broadus John.  History of Present Illness:   Mark Myers is a pleasant 83 y/o with multiple medical problems, followed in [facility]. He was in his usual state of health till Friday when he noted generalized weakness. On 05/22/17 he presented to [facility] with weakness and Lt upper chest pain. He was found to be in AF with RVR with underlying RBBB. He was placed on Diltiazem and Heparin and transferred to [facility] for further evaluation. He has since converted to NSR with improvement in his symptoms. He did have a 5.7 second pause 12/29 and his Diltiazem has been discontinued.  Mark Myers has a history of CAD, s/p RCA PCI in 2007. He has severe AS by echo Oct 2018 with an AVA of 0.7cm. He has CRI-3b with a GFR in the low 30's and a baseline SCr of 2-2.3. He has a history of COPD and lung CA. He is s/p complicated Rt lobectomy in 2005 at [facility]. He is on TID inhalers at home but not O2. He has a history of an AAA, s/p EVAR in 2011. He was recently seen by Dr Maryjean Morn and told his CT looked good, f/u in one year. Other problems include chronic RBBB, HTN, HLD,Bipolar disorder, and chronic anemia secondary to CRI.  He does have GERD and has repeated esophageal; dilatations, two this past year, the last one in Aug. He denies any history of PUD or prior GI bleeding.     Past Medical History:  Diagnosis Date  . AAA (abdominal aortic aneurysm) ([place]) 2011   s/p EAVR, followed by Dr Maryjean Morn. Satble as of Oct 2018  . Aortic stenosis, severe 02/2017   Dr Wyline Copas follows. AVA 0.28 Feb 2017 echo  . Bipolar 1  disorder ([place])   . CAD S/P percutaneous coronary angioplasty   . Chronic anemia    secondary to renal disease  . COPD (chronic obstructive pulmonary disease) ([place])   . CRI (chronic renal insufficiency), stage 3 (moderate) ([facility])    Dr Olivia Mackie follows  . Depression   . GERD (gastroesophageal reflux disease)    with stricture  . High cholesterol   . Hypertension   . Lung cancer ([place])    right   . MI (myocardial infarction) ([place])   . RBBB     Past Surgical History:  Procedure Laterality Date  . ABDOMINAL AORTIC ANEURYSM REPAIR  2011   EVAR-Dr Maryjean Morn  . heart stents  2007   RCA stents  . LUNG LOBECTOMY Right    lower      Home Medications:  Prior to Admission medications   Medication Sig Start Date End Date Taking? Authorizing Provider  aspirin 325 MG tablet Take 325 mg by mouth daily.   Yes [provider]  atorvastatin (LIPITOR) 10 MG tablet Take 10 mg by mouth daily.   Yes [provider]  Cholecalciferol (VITAMIN D-1000 MAX ST) 1000 units tablet Take 1,000 Units by mouth daily.   Yes [provider]  doxazosin (CARDURA) 2 MG tablet Take 2 mg by mouth every evening. 05/17/17  Yes [provider]  furosemide (LASIX) 20 MG tablet Take 20 mg by mouth.   Yes [provider]  hydrALAZINE (APRESOLINE) 50 MG tablet Take 50 mg by mouth 3 (three) times daily. 05/11/17  Yes [provider]  hydrochlorothiazide (HYDRODIURIL) 25 MG tablet Take 25 mg by mouth daily.   Yes [provider]  lamoTRIgine (LAMICTAL) 100 MG tablet Take 300 mg by mouth daily. 03/29/17  Yes [provider]  metoprolol tartrate (LOPRESSOR) 25 MG tablet Take 25 mg by mouth 2 (two) times daily.   Yes [provider]  Multiple Vitamin (MULTIVITAMIN) capsule Take 1 capsule by mouth daily. 11/07/09  Yes [provider]  ULORIC 40 MG tablet Take 40 mg by mouth daily. 04/19/17  Yes [provider]  VENTOLIN HFA 108 (90 Base)  MCG/ACT inhaler Inhale 2 puffs into the lungs every 6 (six) hours as needed for wheezing. 05/17/17  Yes [provider]    Inpatient Medications: Scheduled Meds: . aspirin  325 mg Oral Daily  . atorvastatin  10 mg Oral Daily  . febuxostat  40 mg Oral Daily  . lamoTRIgine  300 mg Oral Daily  . metoprolol tartrate  50 mg Oral BID  . multivitamin with minerals  1 tablet Oral Daily   Continuous Infusions: . diltiazem (CARDIZEM) infusion Stopped (05/22/17 2054)  . heparin 1,200 Units/hr (05/23/17 0712)   PRN Meds: acetaminophen **OR** acetaminophen, albuterol, ondansetron **OR** ondansetron (ZOFRAN) IV  Allergies:    Allergies  Allergen Reactions  . Norvasc [Amlodipine Besylate]     Ankle swelling    Social History:   Social History   Socioeconomic History  . Marital status: Married    Spouse name: Not on file  . Number of children: Not on file  . Years of education: Not on file  . Highest education level: Not on file  Social Needs  . Financial resource strain: Not on file  . Food insecurity - worry: Not on file  . Food insecurity - inability: Not on file  . Transportation needs - medical: Not on file  . Transportation needs - non-medical: Not on file  Occupational History  . Not on file  Tobacco Use  . Smoking status: Former Smoker    Packs/day: 0.75    Years: 70.00    Pack years: 52.50    Types: Cigarettes    Last attempt to quit: 05/23/2015    Years since quitting: 2.0  . Smokeless tobacco: Never Used  Substance and Sexual Activity  . Alcohol use: Yes    Frequency: Never    Comment: 6 oz vodka once a week, occasional glass of wine  . Drug use: No  . Sexual activity: Not on file  Other Topics Concern  . Not on file  Social History Narrative  . Not on file    Family History:    Family History  Problem Relation Age of Onset  . Hypertension Mother   . Hypertension Father   . Parkinson's disease Sister   . Colon cancer Brother      ROS:    Please see the history of present illness.  Review of Systems  Musculoskeletal: Positive for gout.    No melena or GI bleeding. He has had cough the last few days All other ROS reviewed and negative.     Physical Exam/Data:   Vitals:   05/22/17 2001 05/23/17 0000 05/23/17 0445 05/23/17 0820  BP: 119/82 129/66 127/83 (!) 150/77  Pulse: 63 71  72 71  Resp: (!) 21 20 20    Temp: 98.5 F (36.9 C) 98.7 F (37.1 C) 98.3 F (36.8 C) 97.7 F (36.5 C)  TempSrc: Oral Oral Oral Oral  SpO2: 97% 96% 96% 99%  Weight:   185 lb 6.4 oz (84.1 kg)   Height:        Intake/Output Summary (Last 24 hours) at 05/23/2017 0925 Last data filed at 05/23/2017 0300 Gross per 24 hour  Intake 352.37 ml  Output 750 ml  Net -397.63 ml   Filed Weights   05/22/17 1126 05/23/17 0445  Weight: 180 lb (81.6 kg) 185 lb 6.4 oz (84.1 kg)   Body mass index is 25.86 kg/m.  General:  Well nourished, well developed, in no acute distress HEENT: normal Lymph: no adenopathy Neck: no JVD, transmitted AS murmur Endocrine:  No thryomegaly Vascular: No carotid bruits; FA pulses 2+ bilaterally without bruits  Cardiac:  RRR with 2/6 systolic murmur AOV and LSB, diminished S2 Lungs:  Generalized decreased breath sounds with scatter wheezing and faint rales  Abd: soft, nontender, no hepatomegaly  Ext: no edema Musculoskeletal:  No deformities, BUE and BLE strength normal and equal Skin: warm and dry  Neuro:  CNs 2-12 intact, no focal abnormalities noted Psych:  Normal affect   EKG:  The EKG from 12/29 was personally reviewed and demonstrates:  AF with RVR and RBBB Telemetry:  Telemetry was personally reviewed and demonstrates:  AF with conversion to NSR. He did have prolonged pauses yesterday afternoon- 5.7 seconds  Relevant CV Studies:   Laboratory Data:  Chemistry Recent Labs  Lab 05/22/17 1125 05/22/17 2340  NA 140 141  K 4.2 4.3  CL 106 110  CO2 26 25  GLUCOSE 160* 117*  BUN 40* 33*  CREATININE  2.36* 2.21*  CALCIUM 8.9 8.5*  GFRNONAA 24* 25*  GFRAA 27* 30*  ANIONGAP 8 6    Recent Labs  Lab 05/22/17 2340  PROT 5.4*  ALBUMIN 2.8*  AST 16  ALT 11*  ALKPHOS 62  BILITOT 0.6   Hematology Recent Labs  Lab 05/22/17 1125 05/22/17 2340  WBC 7.8 6.2  RBC 3.53* 3.27*  HGB 10.6* 9.7*  HCT 34.3* 31.4*  MCV 97.2 96.0  MCH 30.0 29.7  MCHC 30.9 30.9  RDW 14.3 14.9  PLT 196 186   Cardiac Enzymes Recent Labs  Lab 05/22/17 1124 05/22/17 1848 05/22/17 2340  TROPONINI 0.06* 0.07* 0.07*   Radiology/Studies:  Dg Chest 2 View  Result Date: 05/22/2017 CLINICAL DATA:  Shortness of breath since yesterday EXAM: CHEST  2 VIEW COMPARISON:  None. FINDINGS: The mediastinal contour is normal. The heart size is mildly enlarged. Patchy consolidation of bilateral lung bases are identified. There is no pulmonary edema or pleural effusion. The lungs are hyperinflated. No acute abnormality is identified in the visualized bones. Aortic stent is noted. IMPRESSION: Patchy consolidation of bilateral lung bases, pneumonia is not excluded particularly in the right lung base. Electronically Signed   By: Abelardo Diesel M.D.   On: 05/22/2017 12:16    Assessment and Plan:   AF with RVR Apparently a new problem for him. I suspect it is associated with his URI and COPD. He had 5.7 second pause yesterday and IV Diltiazem was discontinued. He has since converted to NSR. His Lopressor was also increased on admission- We should probably cut this back secondary to significant conduction system disease and COPD with URI and wheezing.  Severe AS I don't have access to the echo  report from Oct but Dr Emilio Math noted an AVA of 0.7 in his office note in Oct. The pt denies exertional chest pain, unusual dyspnea, or syncope. Dr Earlie Counts has discussed possible coronary angiogram for further evaluation with the pt.  CRI-3b Followed by Dr Emilio Math in [facility]. This has been stable at his current level per a recent office  note.   COPD Pt has significant COPD and prior lung CA. He is on inhalers at home. He says he has not required steroids or home O2. He appears to have an acute exacerbation by exam and history.   CAD S/p remote PCI. No recent angina, the chest pain he described on admission was unlike his prior angina. His Troponin is elevated- 0.07 but flat trend and underlying CRI and AF could explain this.   Plan: MD to see. He is a CHADS VASC 4 at least. Consider low dose Eliquis and decreasing his ASA to 81 mg. I would decrease Lopressor back to 25 mg BID, his home dose. Treat URI. Refer back to Dr Earlie Counts when stable.    For questions or updates, please contact [place] Please consult [email or web address] for contact info under Cardiology/STEMI.   Angelena Form, PA-C  05/23/2017 9:25 AM   Patient seen and examined.  Records reviewed as well as previous records from [facility].  Patient has aortic stenosis which is severe as well as underlying COPD and lung cancer.  He also has stage IV chronic kidney disease.  He was brought in last night with weakness and found to be in atrial fibrillation with rapid ventricular response.  He was started on diltiazem and converted to sinus rhythm with a post conversion pause of 5 seconds and has had some brief episodes of atrial fibrillation since then and is also converted to sinus rhythm after each one but without the long conversion pauses.  His metoprolol had been increased and his diltiazem has been discontinued.  The patient has a history of CAD with previous stentingas well as aortic aneurysm in the past.he had noticed a worsening cough and has also been wheezing on examination.  He has been placed on intravenous heparin.  Doesn't have clinical angina at the present time.  On examination he is wheezing particularly on the right side.  There is a 2/6 murmur of aortic stenosis with a diminished S2.  His rhythm is currently regular.  There is no edema noted and  pulses are present peripherally.  Neck veins are not elevated.  Twelve-lead EKG on admission shows atrial fibrillation with a rapid ventricular response.  Subsequent EKG showed sinus rhythm with superior axis and right bundle branch block.  1.  Paroxysmal atrial fibrillation possibly worsened by lung disease CHA2DS2VASC score is 4 2.  Severe aortic stenosis 3.  CAD with borderline flat troponin elevation likely secondary to renal insufficiency and demand ischemia from atrial fibrillation.  I don't think this represents an acute infarction. 4. Hypertension  Recommendations:  He had an echo in October so I'm not sure that we need to repeat this.  I would go ahead and anticoagulate him with Eliquis 2.5 mg twice daily in light of his age and renal function and would reduce him to one baby aspirin daily in light of his prior stents.  We could consider stopping the aspirin altogether.  Stop heparin after on Eliquis.  I would increase metoprolol slightly at this point to 37.5 mg twice daily and observe in the hospital overnight.  I would go ahead  and institute treatment for wheezing and COPD as this may diminish the frequency of atrial fibrillation.

## 2017-05-23 NOTE — Progress Notes (Signed)
05/23/17 0526, central telemetry called to notify nursing pt had 5.72 second pause at [place] 05/22/17.  Pt had IV diltiazem infusing at that time.  IV diltiazem was discontinued as previously documented.  Will continue to monitor pt closely.

## 2017-05-23 NOTE — Plan of Care (Signed)
PO prednisone started for Respiratory congestion. Eliquis.

## 2017-05-23 NOTE — Progress Notes (Signed)
ANTICOAGULATION CONSULT NOTE - Initial Consult  Pharmacy Consult for Eliquis Indication: atrial fibrillation  Allergies  Allergen Reactions  . Norvasc [Amlodipine Besylate]     Ankle swelling    Patient Measurements: Height: 5\' 11"  (180.3 cm) Weight: 185 lb 6.4 oz (84.1 kg) IBW/kg (Calculated) : 75.3  Vital Signs: Temp: 98.4 F (36.9 C) (12/30 1100) Temp Source: Oral (12/30 1100) BP: 153/81 (12/30 1100) Pulse Rate: 62 (12/30 1100)  Labs: Recent Labs    05/22/17 1124 05/22/17 1125 05/22/17 1848 05/22/17 2340 05/23/17 0905  HGB  --  10.6*  --  9.7*  --   HCT  --  34.3*  --  31.4*  --   PLT  --  196  --  186  --   HEPARINUNFRC  --   --   --  0.17* 0.41  CREATININE  --  2.36*  --  2.21*  --   TROPONINI 0.06*  --  0.07* 0.07*  --     Estimated Creatinine Clearance: 26 mL/min (A) (by C-G formula based on SCr of 2.21 mg/dL (H)).   Medical History: Past Medical History:  Diagnosis Date  . AAA (abdominal aortic aneurysm) ([place]) 2011   s/p EAVR, followed by Dr Maryjean Morn. Satble as of Oct 2018  . Aortic stenosis, severe 02/2017   Dr Wyline Copas follows. AVA 0.28 Feb 2017 echo  . Bipolar 1 disorder ([place])   . CAD S/P percutaneous coronary angioplasty   . Chronic anemia    secondary to renal disease  . COPD (chronic obstructive pulmonary disease) ([place])   . CRI (chronic renal insufficiency), stage 3 (moderate) ([facility])    Dr Olivia Mackie follows  . Depression   . GERD (gastroesophageal reflux disease)    with stricture  . High cholesterol   . Hypertension   . Lung cancer ([place])    right   . MI (myocardial infarction) ([place])   . RBBB    Assessment: 41 yoM presents with dizziness, chest pressure, and SOB, found to be in afib with RVR. Currently on IV heparin, pharmacy consulted to transition to Eliquis. Patient weight 84.1 kg, CKD III with SCr currently 2.21 (BL ~2). Hgb 9.7, pltc WNL. No bleeding noted.  Goal of Therapy:  Monitor platelets by anticoagulation protocol: Yes   Plan:   Start Eliquis 2.5mg  PO BID Monitor for s/sx of bleeding, changes in renal function Pharmacy will sign off   N. Gerarda Fraction, PharmD PGY1 Pharmacy Resident Pager: [phone] 05/23/2017,11:39 AM

## 2017-05-24 ENCOUNTER — Other Ambulatory Visit (HOSPITAL_COMMUNITY): Payer: Medicare HMO

## 2017-05-24 DIAGNOSIS — I350 Nonrheumatic aortic (valve) stenosis: Secondary | ICD-10-CM

## 2017-05-24 DIAGNOSIS — I4891 Unspecified atrial fibrillation: Secondary | ICD-10-CM

## 2017-05-24 LAB — BASIC METABOLIC PANEL
Anion gap: 7 (ref 5–15)
BUN: 31 mg/dL — AB (ref 6–20)
CALCIUM: 8.5 mg/dL — AB (ref 8.9–10.3)
CO2: 26 mmol/L (ref 22–32)
CREATININE: 2.07 mg/dL — AB (ref 0.61–1.24)
Chloride: 105 mmol/L (ref 101–111)
GFR calc Af Amer: 32 mL/min — ABNORMAL LOW (ref >60)
GFR, EST NON AFRICAN AMERICAN: 28 mL/min — AB (ref >60)
Glucose, Bld: 114 mg/dL — ABNORMAL HIGH (ref 65–99)
POTASSIUM: 5 mmol/L (ref 3.5–5.1)
SODIUM: 138 mmol/L (ref 135–145)

## 2017-05-24 LAB — CBC
HCT: 30.2 % — ABNORMAL LOW (ref 39.0–52.0)
Hemoglobin: 9.3 g/dL — ABNORMAL LOW (ref 13.0–17.0)
MCH: 29.5 pg (ref 26.0–34.0)
MCHC: 30.8 g/dL (ref 30.0–36.0)
MCV: 95.9 fL (ref 78.0–100.0)
PLATELETS: 188 10*3/uL (ref 150–400)
RBC: 3.15 MIL/uL — AB (ref 4.22–5.81)
RDW: 14.6 % (ref 11.5–15.5)
WBC: 8.4 10*3/uL (ref 4.0–10.5)

## 2017-05-24 MED ORDER — PREDNISONE 20 MG PO TABS: 20.0000 mg | ORAL_TABLET | Freq: Every day | ORAL | 0 refills | Status: DC

## 2017-05-24 MED ORDER — METOPROLOL TARTRATE 25 MG PO TABS: 37.5000 mg | ORAL_TABLET | Freq: Two times a day (BID) | ORAL | 0 refills | Status: DC

## 2017-05-24 MED ORDER — APIXABAN 2.5 MG PO TABS: 2.5000 mg | ORAL_TABLET | Freq: Two times a day (BID) | ORAL | 0 refills | Status: DC

## 2017-05-24 MED ORDER — HYDRALAZINE HCL 50 MG PO TABS: 25.0000 mg | ORAL_TABLET | Freq: Three times a day (TID) | ORAL | Status: DC

## 2017-05-24 MED ORDER — OFF THE BEAT BOOK
Freq: Once | Status: AC
  Administered 2017-05-24: 06:00:00
  Filled 2017-05-24: qty 1

## 2017-05-24 NOTE — Care Management Note (Signed)
Case Management Note  Patient Details  Name: Mark Myers MRN: [id number] Date of Birth: 03-Apr-1930  Subjective/Objective:                    Action/Plan: 30 day free Eliquis Card given and explained to patient.   Patient voiced understanding.   Expected Discharge Date:                  Expected Discharge Plan:  Home/Self Care  In-House Referral:     Discharge planning Services  CM Consult, Medication Assistance  Post Acute Care Choice:    Choice offered to:  Patient  DME Arranged:  N/A DME Agency:  NA  HH Arranged:  NA HH Agency:  NA  Status of Service:  Completed, signed off  If discussed at [place] of Stay Meetings, dates discussed:    Additional Comments:  Marilu Favre, RN 05/24/2017, 10:21 AM

## 2017-05-24 NOTE — Discharge Summary (Signed)
Physician Discharge Summary  [name] WHQ:[id number] DOB: 08-24-1929 DOA: 05/22/2017  PCP: No primary care provider on file.  Admit date: 05/22/2017 Discharge date: 05/24/2017  Time spent: 45 minutes  Recommendations for Outpatient Follow-up:  1. [facility] on 1/2  2. Dr.Cooper -[facility] for evaluation for TAVR, office to arrange [place]   Discharge Diagnoses:  Principal Problem:   Atrial fibrillation with RVR ([place]) Active Problems:   CAD (coronary artery disease)   CKD (chronic kidney disease) stage 3, GFR 30-59 ml/min (HCC)   Bipolar 1 disorder (HCC)   RBBB   CAD S/P percutaneous coronary angioplasty   CRI (chronic renal insufficiency), stage 3 (moderate) (HCC)   AAA (abdominal aortic aneurysm) (HCC)   Aortic stenosis, severe   Hypertension   Chronic anemia   Asystole by electrocardiogram -5.7 seconds   URI (upper respiratory infection)   Discharge Condition: stable  Diet recommendation: low sodium heart healthy  Filed Weights   05/22/17 1126 05/23/17 0445 05/24/17 0416  Weight: 81.6 kg (180 lb) 84.1 kg (185 lb 6.4 oz) 84.1 kg (185 lb 6.5 oz)    History of present illness:  Mark Myers a 83 y.o.malewith medical history significant ofCAD with RCA stent in 2007, CKD3, AAA s/p EVAR in 2011,chronic right bundle branch block, prediabetes, prior history of lung cancer, bipolar disorder presented to the ER with the above symptoms. Patient reports being his in his usual state of health until yesterday when he noticed feeling a little dizzy and weak and mildvaguetightness in the middle of his chest,this subsequently subsided and he went to bed after waking up this morning he started feeling dizzy again, this was associated with weakness and mild shortness of breath In ED, found to be in AFib with RVR started Cardizem gtt with heparin and transferred to [facility] Course:  Atrial fibrillation with RVR ([place]) -has RBBB as underlying rhythm -He was started on a Cardizem drip  on admission subsequently overnight converted to sinus rhythm with a 5 second pause, Cardizem drip discontinued -chads2vasc score>3 -Cardiology consulted, we increased his home dose of metoprolol to 37.5mg  BID -started him on anticoagulation with low dose Eliquis -Closely followed by cardiologist at [facility] and reportedly has severe aortic stenosis based on last echo in October which were not able to see -he will see Dr.Cooper with [facility] to eval for TAVR  History of CAD -remote PCI/stentto RCA in 2007 -troponin minimally elevated likley due to demand -Continue beta blocker and statin  Severeaortic stenosis -Followed by [facility]ologist at [facility] -now referred to Dr.Cooper to consider TAVR -FU ECHO on 1/2 arranged  CKD 3 -Creatinine close to baseline, stable  COPD mild exacerbation -improved with prednisone and nebs -discharged home on prednisone taper, clinically do not suspect pneumonia  ??Atelectasis vs consolidation at bases on portable chest x-ray -Clinically no signs of pneumonia,he's had a chronic cough for months to years -No fever or leukocytosis -stable without antibiotics, suspect atelectasis   AAA s/p EVAR in 2011  H/o Lung Ca s/p resection, - no recurrence based on last scans in [facility]     Consultations:  Cardiology  Discharge Exam: Vitals:   05/24/17 0839 05/24/17 0901  BP: (!) 144/109   Pulse: (!) 115   Resp:    Temp: 97.9 F (36.6 C)   SpO2: 93% 94%    General: AAOx3 Cardiovascular: S1S2/RRR Respiratory: CTAB  Discharge Instructions   Discharge Instructions    Diet - low sodium heart healthy   Complete by:  As directed  Increase activity slowly   Complete by:  As directed      Allergies as of 05/24/2017      Reactions   Norvasc [amlodipine Besylate]    Ankle swelling      Medication List    STOP taking these medications   aspirin 325 MG tablet   hydrochlorothiazide 25 MG tablet Commonly known as:   HYDRODIURIL     TAKE these medications   apixaban 2.5 MG Tabs tablet Commonly known as:  ELIQUIS Take 1 tablet (2.5 mg total) by mouth 2 (two) times daily.   atorvastatin 10 MG tablet Commonly known as:  LIPITOR Take 10 mg by mouth daily.   doxazosin 2 MG tablet Commonly known as:  CARDURA Take 2 mg by mouth every evening.   furosemide 20 MG tablet Commonly known as:  LASIX Take 20 mg by mouth.   hydrALAZINE 50 MG tablet Commonly known as:  APRESOLINE Take 0.5 tablets (25 mg total) by mouth 3 (three) times daily. What changed:  how much to take   lamoTRIgine 100 MG tablet Commonly known as:  LAMICTAL Take 300 mg by mouth daily.   metoprolol tartrate 25 MG tablet Commonly known as:  LOPRESSOR Take 1.5 tablets (37.5 mg total) by mouth 2 (two) times daily. What changed:  how much to take   multivitamin capsule Take 1 capsule by mouth daily.   predniSONE 20 MG tablet Commonly known as:  DELTASONE Take 1 tablet (20 mg total) by mouth daily with breakfast. Take 20mg  for 2days then STOP Start taking on:  05/25/2017   ULORIC 40 MG tablet Generic drug:  febuxostat Take 40 mg by mouth daily.   VENTOLIN HFA 108 (90 Base) MCG/ACT inhaler Generic drug:  albuterol Inhale 2 puffs into the lungs every 6 (six) hours as needed for wheezing.   VITAMIN D-1000 MAX ST 1000 units tablet Generic drug:  Cholecalciferol Take 1,000 Units by mouth daily.      Allergies  Allergen Reactions  . Norvasc [Amlodipine Besylate]     Ankle swelling   Follow-up Information    [place]. Go on 05/26/2017.   Why:  @ 2:45 pm for an ultrasound of your heart.  Contact information: [place] [address] [phone]       Sherren Mocha, MD. Go in 2 week(s).   Specialty:  Cardiology Why:  office will call you Contact information: [address] [place]  [address] [phone]            The results of significant diagnostics from this hospitalization (including imaging, microbiology, ancillary and laboratory) are listed below for reference.    Significant Diagnostic Studies: Dg Chest 2 View  Result Date: 05/23/2017 CLINICAL DATA:  Shortness of breath and weakness yesterday. Recently diagnosed with atrial fibrillation. EXAM: CHEST  2 VIEW COMPARISON:  One day prior FINDINGS: Loss of thoracic vertebral body height at multiple levels, mild. Hyperinflation. Midline trachea. Moderate cardiomegaly with transverse aortic atherosclerosis. No pleural effusion or pneumothorax. Bullous type emphysema, most apparent at the right apex. Moderate lower lobe predominant interstitial thickening. This is slightly greater on the right than left, similar. IMPRESSION: COPD/chronic bronchitis. Bullous disease at the apices. Asymmetric interstitial thickening at the lung bases, similar. Favored to all be related to COPD/chronic bronchitis. At the medial right lung base, early pneumonia cannot be excluded but is felt less likely. Aortic Atherosclerosis (ICD10-I70.0). Electronically Signed   By: Abigail Miyamoto  M.D.   On: 05/23/2017 16:54   Dg Chest 2 View  Result Date: 05/22/2017 CLINICAL DATA:  Shortness of breath since yesterday EXAM: CHEST  2 VIEW COMPARISON:  None. FINDINGS: The mediastinal contour is normal. The heart size is mildly enlarged. Patchy consolidation of bilateral lung bases are identified. There is no pulmonary edema or pleural effusion. The lungs are hyperinflated. No acute abnormality is identified in the visualized bones. Aortic stent is noted. IMPRESSION: Patchy consolidation of bilateral lung bases, pneumonia is not excluded particularly in the right lung base. Electronically Signed   By: Abelardo Diesel M.D.   On: 05/22/2017 12:16    Microbiology: Recent Results (from the past 240 hour(s))  MRSA PCR Screening     Status: None   Collection Time:  05/22/17  6:04 PM  Result Value Ref Range Status   MRSA by PCR NEGATIVE NEGATIVE Final    Comment:        The GeneXpert MRSA Assay (FDA approved for NASAL specimens only), is one component of a comprehensive MRSA colonization surveillance program. It is not intended to diagnose MRSA infection nor to guide or monitor treatment for MRSA infections.      Labs: Basic Metabolic Panel: Recent Labs  Lab 05/22/17 1125 05/22/17 2340 05/24/17 0322  NA 140 141 138  K 4.2 4.3 5.0  CL 106 110 105  CO2 26 25 26   GLUCOSE 160* 117* 114*  BUN 40* 33* 31*  CREATININE 2.36* 2.21* 2.07*  CALCIUM 8.9 8.5* 8.5*   Liver Function Tests: Recent Labs  Lab 05/22/17 2340  AST 16  ALT 11*  ALKPHOS 62  BILITOT 0.6  PROT 5.4*  ALBUMIN 2.8*   No results for input(s): LIPASE, AMYLASE in the last 168 hours. No results for input(s): AMMONIA in the last 168 hours. CBC: Recent Labs  Lab 05/22/17 1125 05/22/17 2340 05/24/17 0322  WBC 7.8 6.2 8.4  HGB 10.6* 9.7* 9.3*  HCT 34.3* 31.4* 30.2*  MCV 97.2 96.0 95.9  PLT 196 186 188   Cardiac Enzymes: Recent Labs  Lab 05/22/17 1124 05/22/17 1848 05/22/17 2340  TROPONINI 0.06* 0.07* 0.07*   BNP: BNP (last 3 results) No results for input(s): BNP in the last 8760 hours.  ProBNP (last 3 results) No results for input(s): PROBNP in the last 8760 hours.  CBG: No results for input(s): GLUCAP in the last 168 hours.     Signed:  Domenic Polite MD.  [facility] 05/24/2017, 11:16 AM

## 2017-05-24 NOTE — Progress Notes (Signed)
Primary Cardiologist:  Mark Myers [facility] seen by Dr Mark Myers here  Subjective:  Denies SSCP, palpitations or Dyspnea He and son had lots of questions about AS and TAVR   Objective:  Vitals:   05/24/17 0037 05/24/17 0416 05/24/17 0839 05/24/17 0901  BP: 127/72 (!) 146/75 (!) 144/109   Pulse:  72 (!) 115   Resp:      Temp: 99.3 F (37.4 C) 98.4 F (36.9 C) 97.9 F (36.6 C)   TempSrc: Oral Oral Oral   SpO2: 96% 98% 93% 94%  Weight:  185 lb 6.5 oz (84.1 kg)    Height:        Intake/Output from previous day:  Intake/Output Summary (Last 24 hours) at 05/24/2017 0908 Last data filed at 05/24/2017 0841 Gross per 24 hour  Intake 720 ml  Output -  Net 720 ml    Physical Exam: Affect appropriate Chronically ill elderly white male  HEENT: normal Neck supple with no adenopathy JVP normal no bruits no thyromegaly Lungs post right lobectomy no wheezing  Heart:  S1/S2diminished AS murmur   , no rub, gallop or click PMI normal Abdomen: benighn, BS positve, no tenderness, no AAA no bruit.  No HSM or HJR Distal pulses intact with no bruits No edema Neuro non-focal Skin warm and dry No muscular weakness   Lab Results: Basic Metabolic Panel: Recent Labs    05/22/17 2340 05/24/17 0322  NA 141 138  K 4.3 5.0  CL 110 105  CO2 25 26  GLUCOSE 117* 114*  BUN 33* 31*  CREATININE 2.21* 2.07*  CALCIUM 8.5* 8.5*   Liver Function Tests: Recent Labs    05/22/17 2340  AST 16  ALT 11*  ALKPHOS 62  BILITOT 0.6  PROT 5.4*  ALBUMIN 2.8*   No results for input(s): LIPASE, AMYLASE in the last 72 hours. CBC: Recent Labs    05/22/17 2340 05/24/17 0322  WBC 6.2 8.4  HGB 9.7* 9.3*  HCT 31.4* 30.2*  MCV 96.0 95.9  PLT 186 188   Cardiac Enzymes: Recent Labs    05/22/17 1124 05/22/17 1848 05/22/17 2340  TROPONINI 0.06* 0.07* 0.07*   Thyroid Function Tests: Recent Labs    05/22/17 1848  TSH 0.733   Anemia Panel: No results for input(s): VITAMINB12, FOLATE, FERRITIN,  TIBC, IRON, RETICCTPCT in the last 72 hours.  Imaging: Dg Chest 2 View  Result Date: 05/23/2017 CLINICAL DATA:  Shortness of breath and weakness yesterday. Recently diagnosed with atrial fibrillation. EXAM: CHEST  2 VIEW COMPARISON:  One day prior FINDINGS: Loss of thoracic vertebral body height at multiple levels, mild. Hyperinflation. Midline trachea. Moderate cardiomegaly with transverse aortic atherosclerosis. No pleural effusion or pneumothorax. Bullous type emphysema, most apparent at the right apex. Moderate lower lobe predominant interstitial thickening. This is slightly greater on the right than left, similar. IMPRESSION: COPD/chronic bronchitis. Bullous disease at the apices. Asymmetric interstitial thickening at the lung bases, similar. Favored to all be related to COPD/chronic bronchitis. At the medial right lung base, early pneumonia cannot be excluded but is felt less likely. Aortic Atherosclerosis (ICD10-I70.0). Electronically Signed   By: Mark Myers M.D.   On: 05/23/2017 16:54   Dg Chest 2 View  Result Date: 05/22/2017 CLINICAL DATA:  Shortness of breath since yesterday EXAM: CHEST  2 VIEW COMPARISON:  None. FINDINGS: The mediastinal contour is normal. The heart size is mildly enlarged. Patchy consolidation of bilateral lung bases are identified. There is no pulmonary edema or pleural effusion. The lungs  are hyperinflated. No acute abnormality is identified in the visualized bones. Aortic stent is noted. IMPRESSION: Patchy consolidation of bilateral lung bases, pneumonia is not excluded particularly in the right lung base. Electronically Signed   By: Mark Myers M.D.   On: 05/22/2017 12:16    Cardiac Studies:  ECG:  afib nonspecific ST changes    Telemetry: afib rates 76-100  Echo: 08/04/16 at [facility] results not available in Epic   Medications:   . apixaban  2.5 mg Oral BID  . aspirin  81 mg Oral Daily  . atorvastatin  10 mg Oral Daily  . febuxostat  40 mg Oral Daily  .  ipratropium-albuterol  3 mL Nebulization Q6H  . lamoTRIgine  300 mg Oral Daily  . metoprolol tartrate  37.5 mg Oral BID  . multivitamin with minerals  1 tablet Oral Daily  . predniSONE  40 mg Oral Q breakfast      Assessment/Plan:  Afib:  Rate control ok On low dose eliquis due to age and CRF. F/U Dr Mark Myers. Tikosyn may be better AAT Than amiodarone given lung disease.   AS:  Severe on exam will arrange outpatient f/u with Dr Mark Myers to explore TAVR outpatient echo in our office [facility] does not offer this and he prefers to have w/u here Will need to stage cath, CT;s given renal failure  CRF:  Stable baseline Cr around 2.0   COPD:  Sees Mark Myers no active wheezing prednisone taper and inhalers per primary service  Ok to d/c home will arrange outpatient f/u for TAVR evaluation   Mark Myers 05/24/2017, 9:08 AM

## 2017-05-26 ENCOUNTER — Ambulatory Visit (HOSPITAL_COMMUNITY): Payer: Medicare HMO | Attending: Cardiovascular Disease

## 2017-05-26 ENCOUNTER — Other Ambulatory Visit: Payer: Self-pay

## 2017-05-26 DIAGNOSIS — I082 Rheumatic disorders of both aortic and tricuspid valves: Secondary | ICD-10-CM | POA: Insufficient documentation

## 2017-05-26 DIAGNOSIS — I350 Nonrheumatic aortic (valve) stenosis: Secondary | ICD-10-CM | POA: Diagnosis present

## 2017-05-26 DIAGNOSIS — I5030 Unspecified diastolic (congestive) heart failure: Secondary | ICD-10-CM | POA: Insufficient documentation

## 2017-05-26 DIAGNOSIS — I420 Dilated cardiomyopathy: Secondary | ICD-10-CM | POA: Diagnosis not present

## 2018-04-24 DEATH — deceased

## 2018-12-05 IMAGING — DX DG CHEST 2V
3 series · 3 of 3 positions shown · non-contrast
Comparison: None.

CLINICAL DATA: Shortness of breath since yesterday

EXAM:
CHEST  2 VIEW

[chest pa]
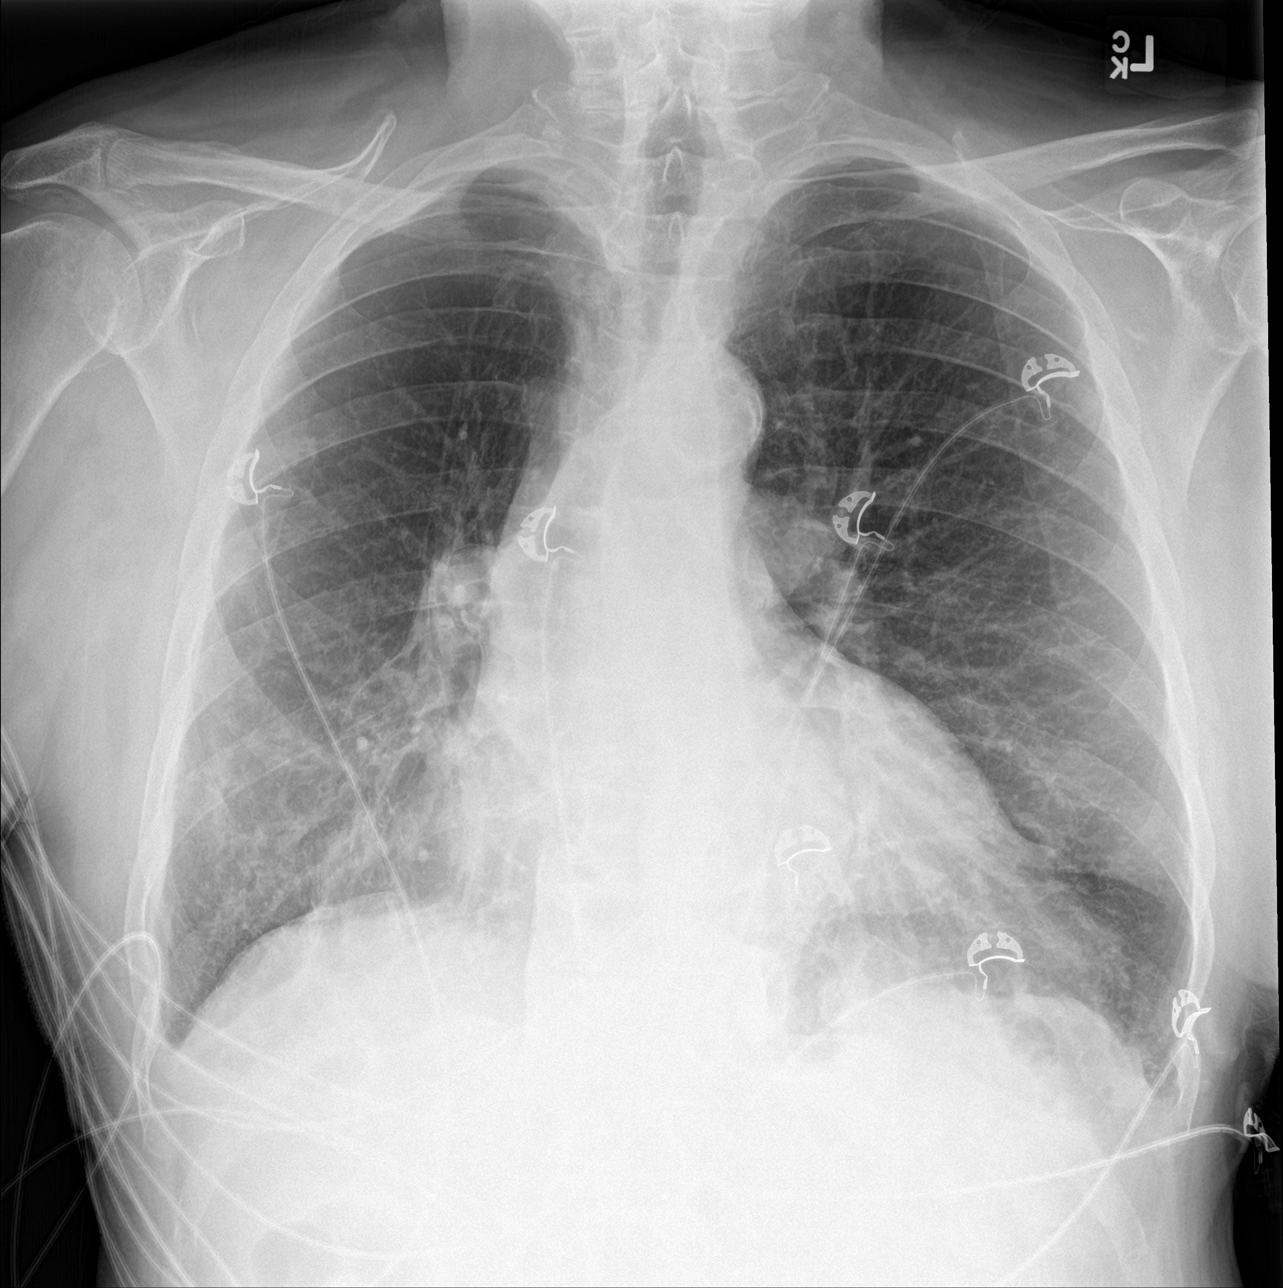

[chest lat (1 of 2)]
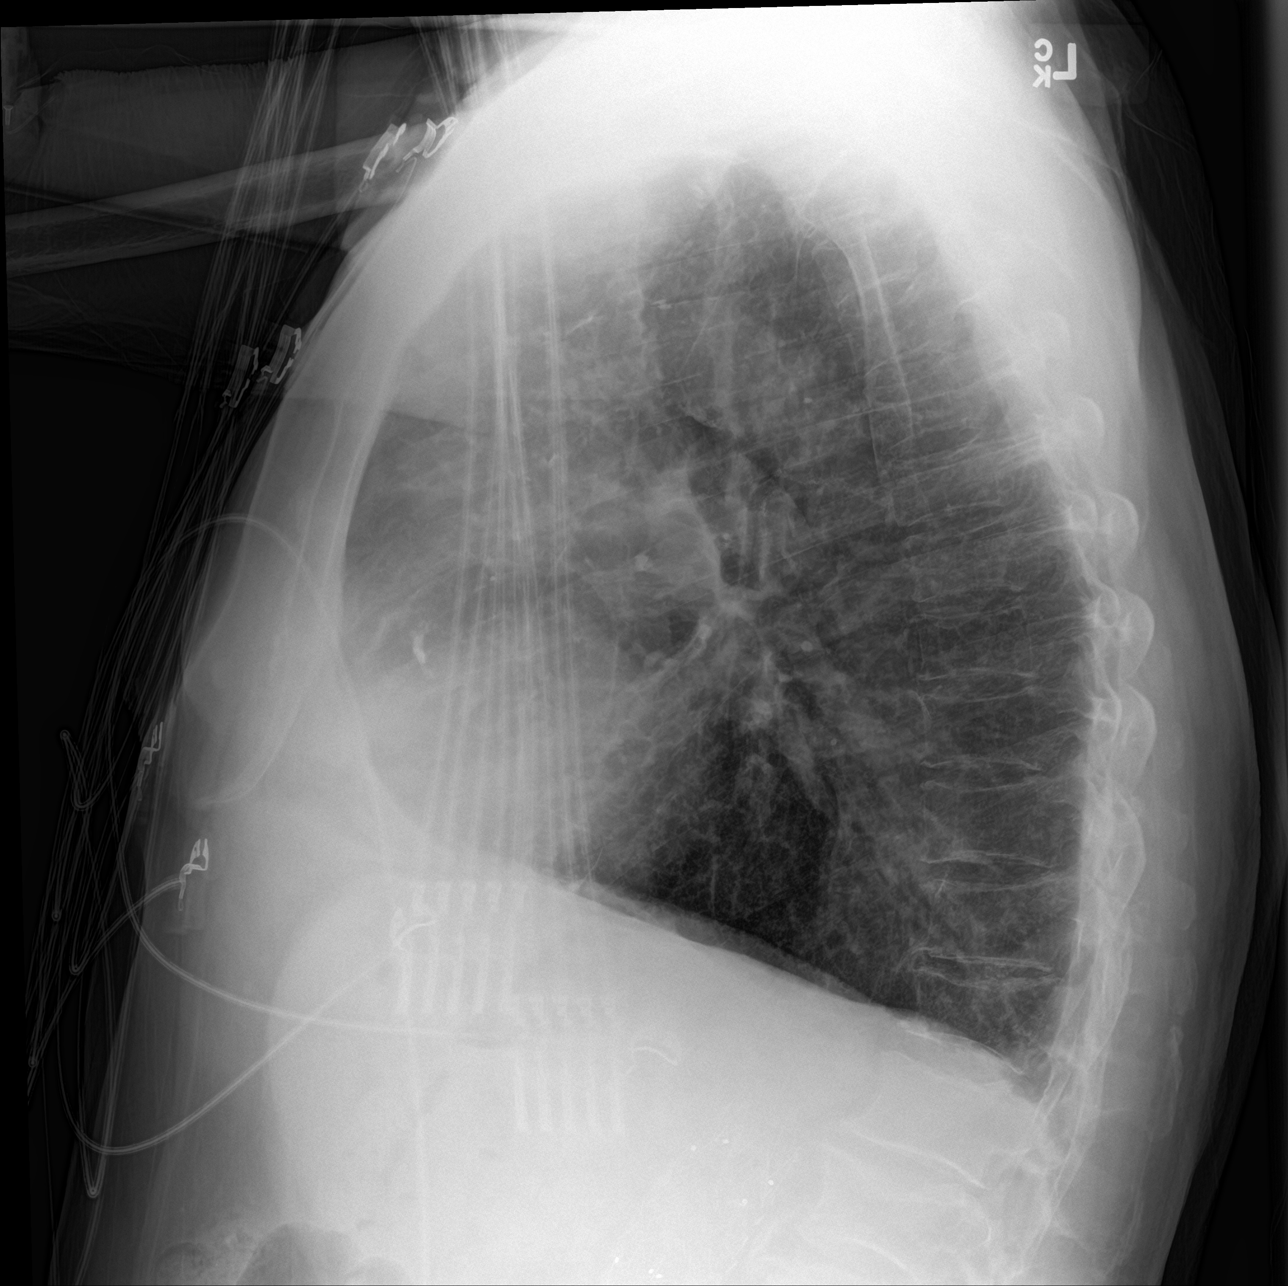

[chest lat (2 of 2)]
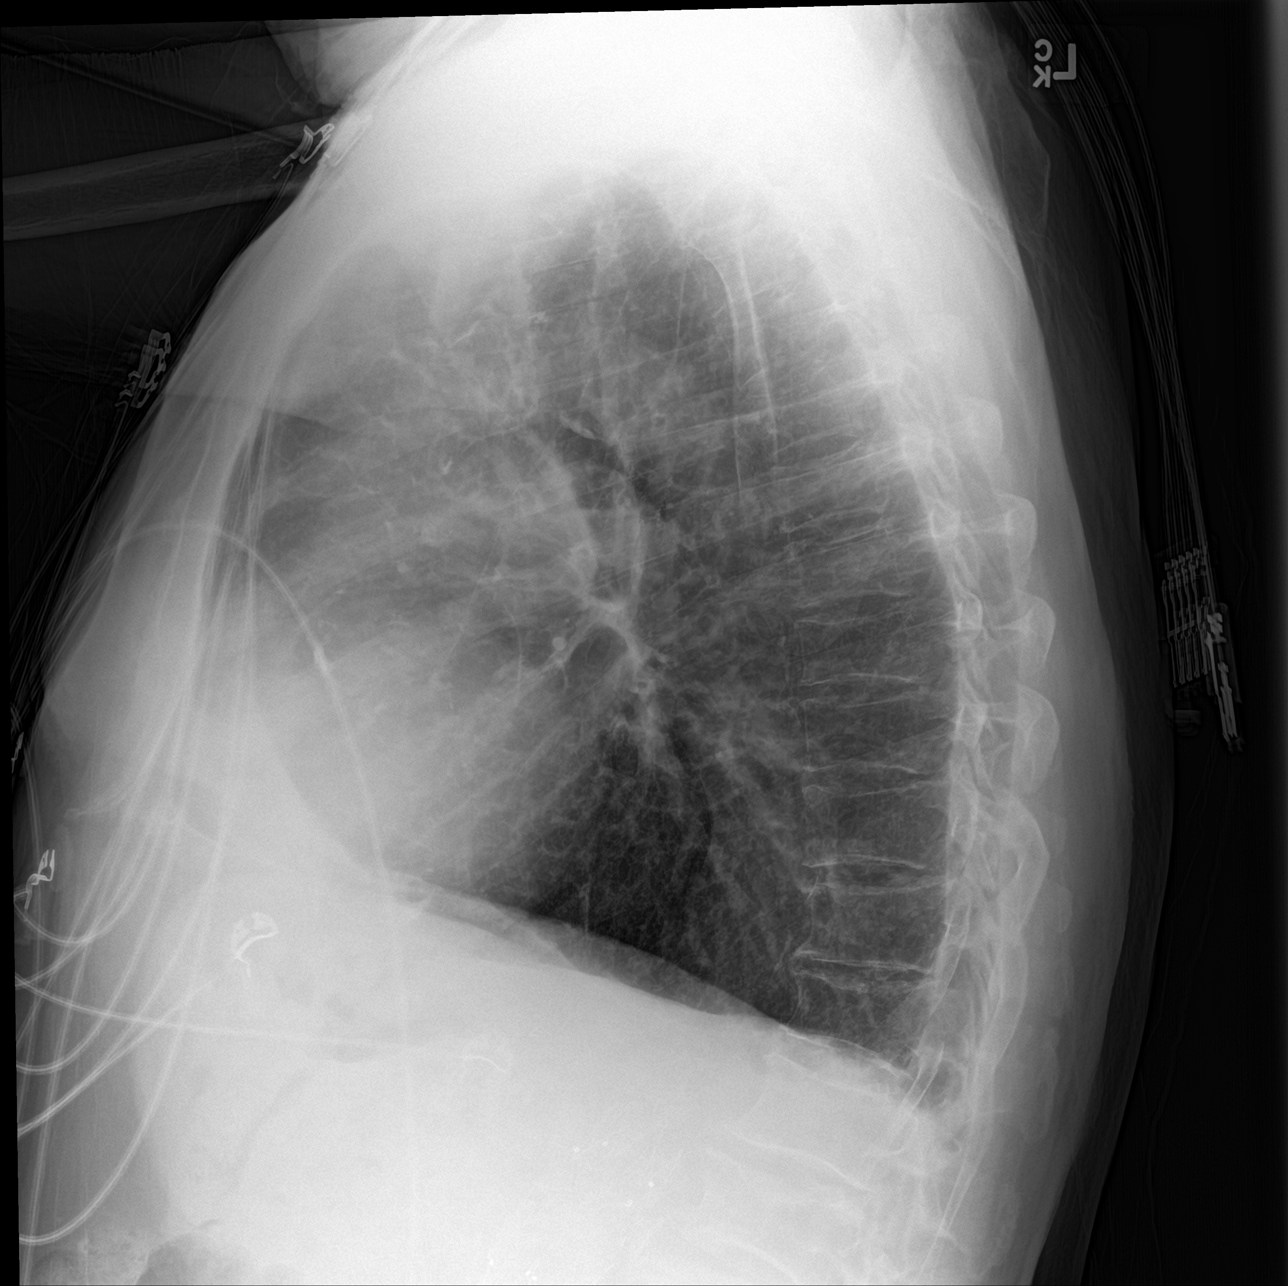

[3 of 3 positions shown; findings below may reference images not displayed]

FINDINGS: The mediastinal contour is normal. The heart size is mildly
enlarged. Patchy consolidation of bilateral lung bases are
identified. There is no pulmonary edema or pleural effusion. The
lungs are hyperinflated. No acute abnormality is identified in the
visualized bones. Aortic stent is noted.
IMPRESSION: Patchy consolidation of bilateral lung bases, pneumonia is not
excluded particularly in the right lung base.

## 2018-12-06 IMAGING — DX DG CHEST 2V
2 series · 2 of 2 positions shown · non-contrast
Comparison: One day prior

CLINICAL DATA: Shortness of breath and weakness yesterday. Recently
diagnosed with atrial fibrillation.

EXAM:
CHEST  2 VIEW

[w chest pa]
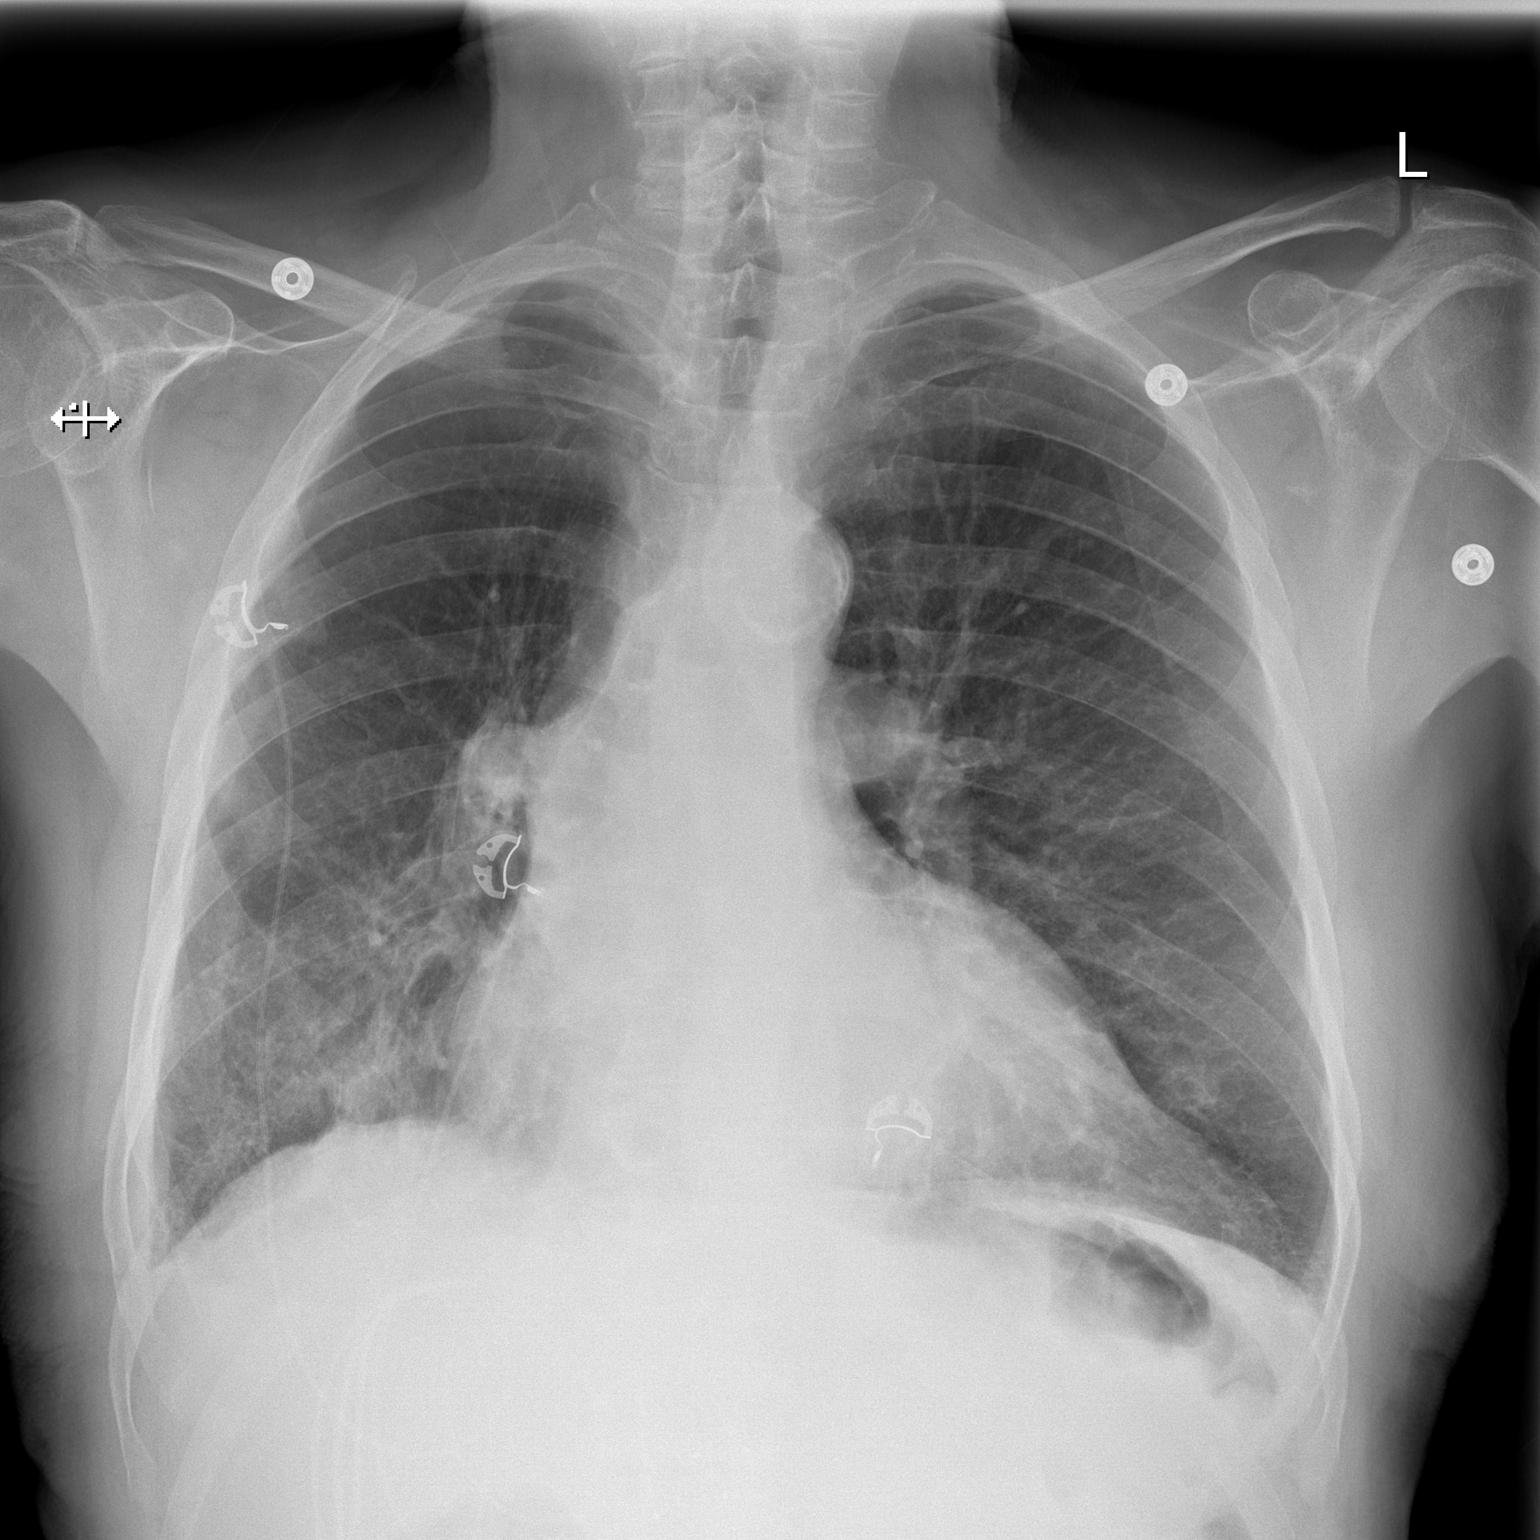

[w chest lat]
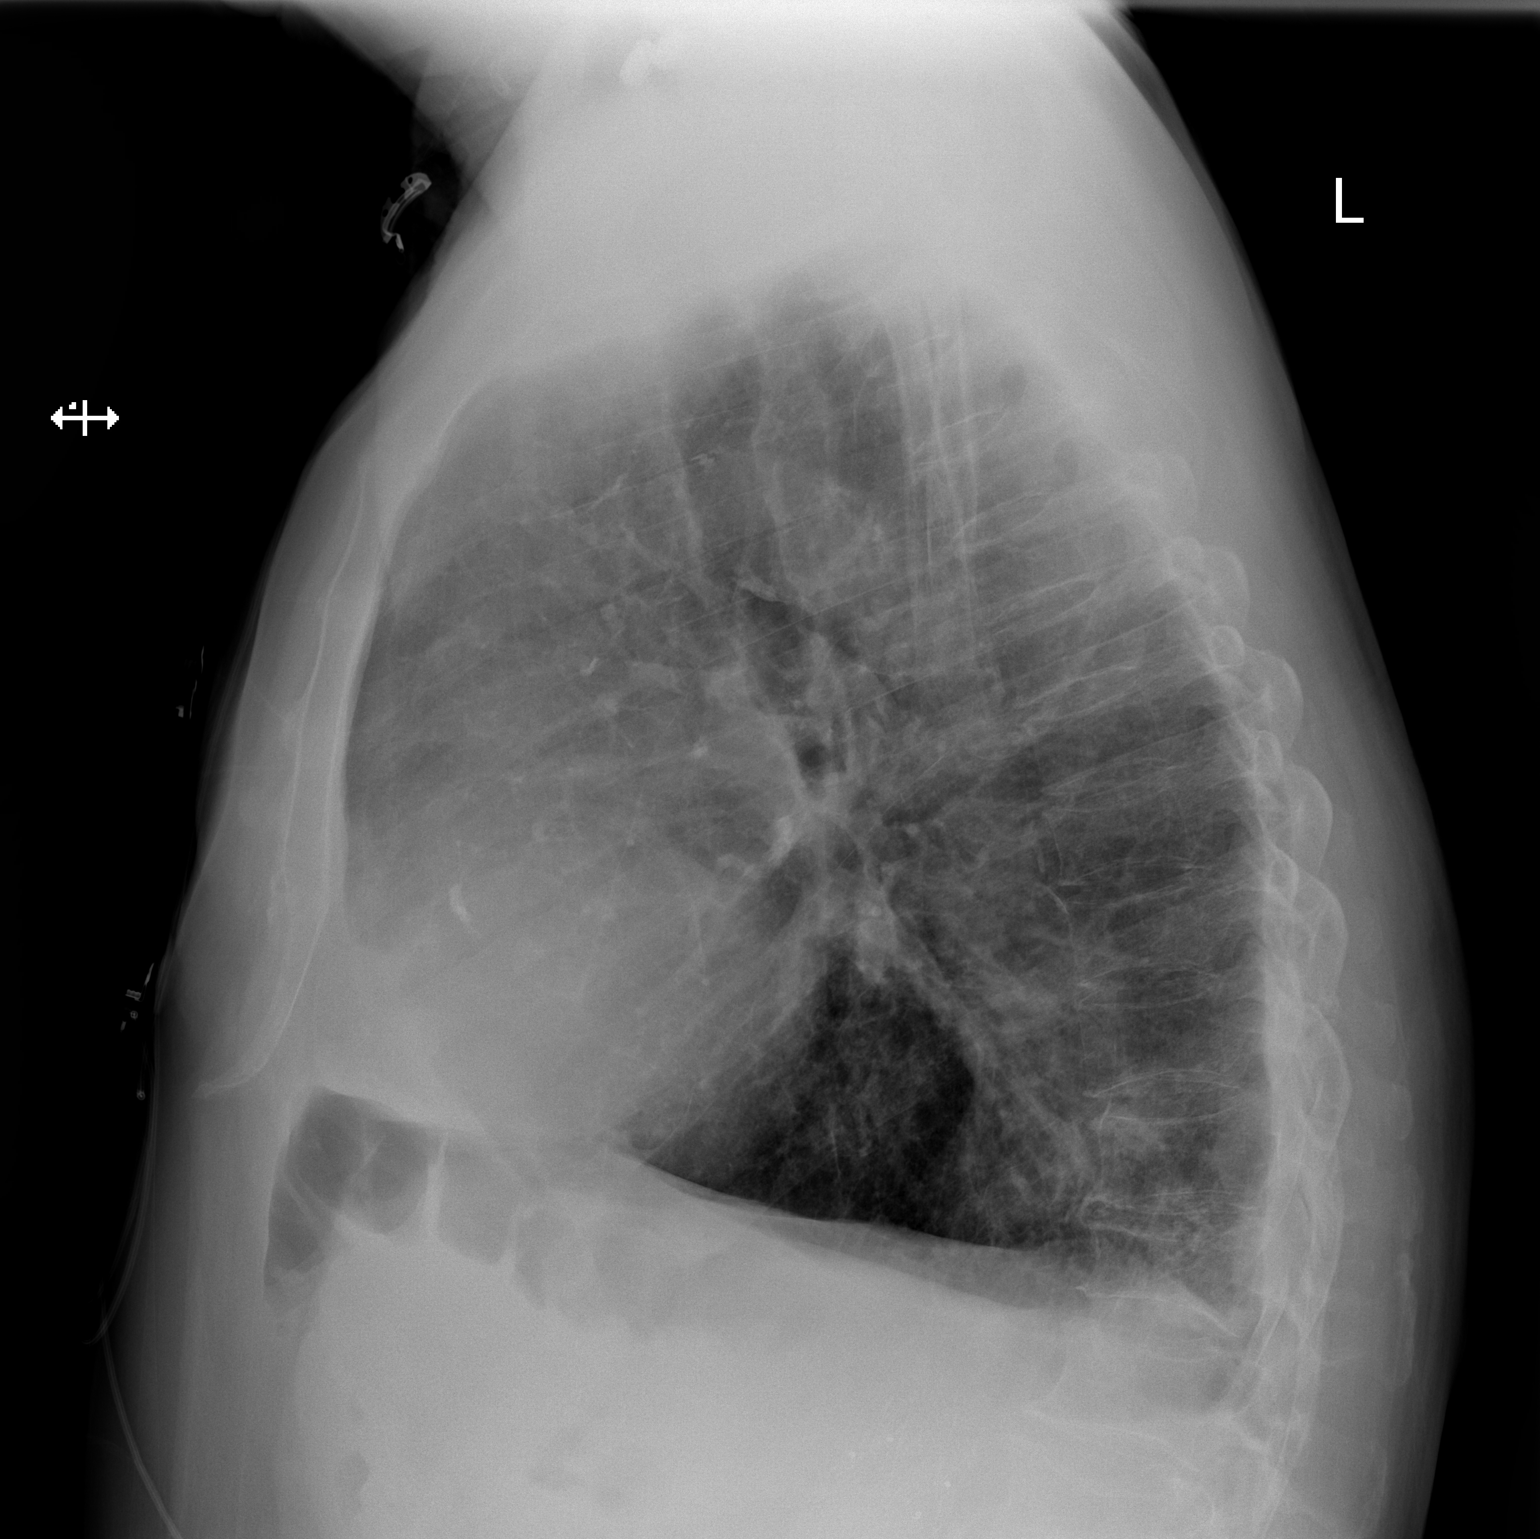

[2 of 2 positions shown; findings below may reference images not displayed]

FINDINGS: Loss of thoracic vertebral body height at multiple levels, mild.
Hyperinflation. Midline trachea. Moderate cardiomegaly with
transverse aortic atherosclerosis. No pleural effusion or
pneumothorax. Bullous type emphysema, most apparent at the right
apex. Moderate lower lobe predominant interstitial thickening. This
is slightly greater on the right than left, similar.
IMPRESSION: COPD/chronic bronchitis. Bullous disease at the apices. Asymmetric
interstitial thickening at the lung bases, similar. Favored to all
be related to COPD/chronic bronchitis. At the medial right lung
base, early pneumonia cannot be excluded but is felt less likely.

Aortic Atherosclerosis ([id number]).
